# Patient Record
Sex: Male | Born: 1990 | Hispanic: No | Marital: Single | Smoking: Former smoker
Health system: Southern US, Community
[De-identification: ages and names within clinical notes are randomized; demographics above are authoritative.]

## PROBLEM LIST (undated history)

## (undated) DIAGNOSIS — G43909 Migraine, unspecified, not intractable, without status migrainosus: Secondary | ICD-10-CM

## (undated) DIAGNOSIS — E119 Type 2 diabetes mellitus without complications: Secondary | ICD-10-CM

## (undated) DIAGNOSIS — R739 Hyperglycemia, unspecified: Secondary | ICD-10-CM

## (undated) DIAGNOSIS — K760 Fatty (change of) liver, not elsewhere classified: Secondary | ICD-10-CM

## (undated) HISTORY — PX: APPENDECTOMY: SHX54

## (undated) HISTORY — DX: Fatty (change of) liver, not elsewhere classified: K76.0

## (undated) HISTORY — DX: Hyperglycemia, unspecified: R73.9

---
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
## 2013-04-11 ENCOUNTER — Emergency Department: Payer: Self-pay | Admitting: Emergency Medicine

## 2017-01-04 ENCOUNTER — Encounter: Payer: Self-pay | Admitting: General Surgery

## 2017-01-04 ENCOUNTER — Ambulatory Visit (INDEPENDENT_AMBULATORY_CARE_PROVIDER_SITE_OTHER): Payer: Commercial Managed Care - PPO | Admitting: General Surgery

## 2017-01-04 VITALS — BP 122/80 | HR 82 | Resp 12 | Ht 72.0 in | Wt 254.0 lb

## 2017-01-04 DIAGNOSIS — L0501 Pilonidal cyst with abscess: Secondary | ICD-10-CM

## 2017-01-04 NOTE — Progress Notes (Signed)
Patient ID: Nathaniel Clark, male   DOB: 04/09/1991, 26 y.o.   MRN: [id number]  Chief Complaint  Patient presents with  . Other  . Routine Post Op    HPI Nathaniel Clark is a 26 y.o. male here today for a evaluation of a pilonidal cyst. [facility]. Patient states he noticed this area about a week ago. Painful and hard to sit. He states this hs been going on for over five years now. He states the area has been been this bad before.  HPI  No past medical history on file.  Past Surgical History:  Procedure Laterality Date  . APPENDECTOMY      No family history on file.  Social History Social History  Substance Use Topics  . Smoking status: Light Tobacco Smoker  . Smokeless tobacco: Never Used  . Alcohol use Yes    No Known Allergies  Current Outpatient Prescriptions  Medication Sig Dispense Refill  . sulfamethoxazole-trimethoprim (BACTRIM DS,SEPTRA DS) 800-160 MG tablet Take 1 tablet by mouth 2 (two) times daily. for 10 days  0  . traMADol (ULTRAM) 50 MG tablet Take 50 mg by mouth every 6 (six) hours as needed. for pain  0   No current facility-administered medications for this visit.     Review of Systems Review of Systems  Constitutional: Negative.   Respiratory: Negative.   Cardiovascular: Negative.     Blood pressure 122/80, pulse 82, resp. rate 12, height 6' (1.829 m), weight 254 lb (115.2 kg).  Physical Exam Physical Exam  Constitutional: He is oriented to person, place, and time. He appears well-developed and well-nourished.  Eyes: Conjunctivae are normal.  Lymphadenopathy:       Right: No inguinal adenopathy present.       Left: No inguinal adenopathy present.  Neurological: He is alert and oriented to person, place, and time.  Skin: Skin is warm and dry.       Data Reviewed Notes reviewed   Assessment    Pilonidal abscess. Recommended drainage- procedure explained. Pt was agreeable.    Plan    Procedure: Incision and  drainage of pilonidal abscess The wound was anesthetized with 3 mL 0.5% marcaine mixed with 1% xylocaine and prepped with betadine.  A cruciate incision was made and murky purulent fluid mixed with blood was drained. Hemostat was used to evacuate the cavity. The incision was dressed with gauze and tape. The patient tolerated the procedure with mild discomfort. He was advised on wound care and told to complete the course of antibiotics (Bactrim). He may use heat. He was told to follow up in clinic in 10 days for wound evaluation.      HPI, Physical Exam, Assessment and Plan have been scribed under the direction and in the presence of Kathreen CosierS. G. Braddock Servellon, MD  Ples SpecterJessica Qualls, CMA  I have completed the exam and reviewed the above documentation for accuracy and completeness.  I agree with the above.  [employer] has been used and any errors in dictation or transcription are unintentional.  Danyah Guastella G. Evette CristalSankar, M.D., F.A.C.S.   Gerlene BurdockSANKAR,Zeva Leber G 01/04/2017, 3:54 PM

## 2017-01-16 ENCOUNTER — Ambulatory Visit: Payer: Commercial Managed Care - PPO | Admitting: General Surgery

## 2017-03-06 ENCOUNTER — Encounter: Payer: Self-pay | Admitting: *Deleted

## 2019-04-24 ENCOUNTER — Emergency Department
Admission: EM | Admit: 2019-04-24 | Discharge: 2019-04-24 | Disposition: A | Discharge status: Home / Self Care | Payer: Commercial Managed Care - PPO | Attending: Emergency Medicine | Admitting: Emergency Medicine

## 2019-04-24 ENCOUNTER — Other Ambulatory Visit: Payer: Self-pay

## 2019-04-24 ENCOUNTER — Emergency Department: Payer: Commercial Managed Care - PPO

## 2019-04-24 DIAGNOSIS — R519 Headache, unspecified: Secondary | ICD-10-CM | POA: Diagnosis not present

## 2019-04-24 DIAGNOSIS — F17200 Nicotine dependence, unspecified, uncomplicated: Secondary | ICD-10-CM | POA: Diagnosis not present

## 2019-04-24 DIAGNOSIS — R112 Nausea with vomiting, unspecified: Secondary | ICD-10-CM | POA: Diagnosis not present

## 2019-04-24 LAB — BASIC METABOLIC PANEL
Anion gap: 6 (ref 5–15)
BUN: 13 mg/dL (ref 6–20)
CO2: 29 mmol/L (ref 22–32)
Calcium: 8.9 mg/dL (ref 8.9–10.3)
Chloride: 104 mmol/L (ref 98–111)
Creatinine, Ser: 0.86 mg/dL (ref 0.61–1.24)
GFR calc Af Amer: >60 mL/min (ref >60)
GFR calc non Af Amer: >60 mL/min (ref >60)
Glucose, Bld: 103 mg/dL — ABNORMAL HIGH (ref 70–99)
Potassium: 3.8 mmol/L (ref 3.5–5.1)
Sodium: 139 mmol/L (ref 135–145)

## 2019-04-24 LAB — CBC
HCT: 44.8 % (ref 39.0–52.0)
Hemoglobin: 15.6 g/dL (ref 13.0–17.0)
MCH: 30.1 pg (ref 26.0–34.0)
MCHC: 34.8 g/dL (ref 30.0–36.0)
MCV: 86.3 fL (ref 80.0–100.0)
Platelets: 214 10*3/uL (ref 150–400)
RBC: 5.19 MIL/uL (ref 4.22–5.81)
RDW: 12.6 % (ref 11.5–15.5)
WBC: 6.2 10*3/uL (ref 4.0–10.5)
nRBC: 0 % (ref 0.0–0.2)

## 2019-04-24 MED ORDER — ONDANSETRON HCL 4 MG/2ML IJ SOLN
4.0000 mg | Freq: Once | INTRAMUSCULAR | Status: AC
  Administered 2019-04-24: 4 mg via INTRAVENOUS
  Filled 2019-04-24: qty 2

## 2019-04-24 MED ORDER — BUTALBITAL-APAP-CAFFEINE 50-325-40 MG PO TABS: 1.0000 | ORAL_TABLET | Freq: Four times a day (QID) | ORAL | 0 refills | Status: AC | PRN

## 2019-04-24 MED ORDER — ONDANSETRON 4 MG PO TBDP
4.0000 mg | ORAL_TABLET | Freq: Once | ORAL | Status: AC
  Administered 2019-04-24: 4 mg via ORAL
  Filled 2019-04-24: qty 1

## 2019-04-24 MED ORDER — IOHEXOL 350 MG/ML SOLN
75.0000 mL | Freq: Once | INTRAVENOUS | Status: AC | PRN
  Administered 2019-04-24: 75 mL via INTRAVENOUS

## 2019-04-24 MED ORDER — KETOROLAC TROMETHAMINE 30 MG/ML IJ SOLN
30.0000 mg | Freq: Once | INTRAMUSCULAR | Status: AC
  Administered 2019-04-24: 30 mg via INTRAVENOUS
  Filled 2019-04-24: qty 1

## 2019-04-24 NOTE — ED Provider Notes (Signed)
[facility] Emergency Department Provider Note ______________________   First MD Initiated Contact with Patient 04/24/19 [phone]     (approximate)  I have reviewed the triage vital signs and the nursing notes.   HISTORY  Chief Complaint Headache    HPI Nathaniel Clark is a 28 y.o. male presents to the emergency department secondary to a 1 week history of occipital headache which patient states radiates to the frontal area.  Patient states current pain score 6 out of 10 but at maximum intensity 10 out of 10.  Patient also admits to nausea and one episode of vomiting tonight.  Patient denies any fever no neck pain or stiffness.  Patient denies any weakness numbness gait instability or visual changes.  Patient does however admit to right eye tearing.  Patient denies any familial history of migraines or aneurysms.        History reviewed. No pertinent past medical history.  There are no problems to display for this patient.   Past Surgical History:  Procedure Laterality Date  . APPENDECTOMY      Prior to Admission medications   Medication Sig Start Date End Date Taking? Authorizing Provider  butalbital-acetaminophen-caffeine (FIORICET) 50-325-40 MG tablet Take 1 tablet by mouth every 6 (six) hours as needed for headache. 04/24/19 04/23/20  Darci CurrentBrown, [place] N, MD  sulfamethoxazole-trimethoprim (BACTRIM DS,SEPTRA DS) 800-160 MG tablet Take 1 tablet by mouth 2 (two) times daily. for 10 days 01/02/17   [provider]  traMADol (ULTRAM) 50 MG tablet Take 50 mg by mouth every 6 (six) hours as needed. for pain 01/02/17   [provider]    Allergies Patient has no known allergies.  History reviewed. No pertinent family history.  Social History Social History   Tobacco Use  . Smoking status: Light Tobacco Smoker  . Smokeless tobacco: Never Used  Substance Use Topics  . Alcohol use: Yes  . Drug use: No    Review of Systems Constitutional:  No fever/chills Eyes: No visual changes. ENT: No sore throat. Cardiovascular: Denies chest pain. Respiratory: Denies shortness of breath. Gastrointestinal: No abdominal pain.  No nausea, no vomiting.  No diarrhea.  No constipation. Genitourinary: Negative for dysuria. Musculoskeletal: Negative for neck pain.  Negative for back pain. Integumentary: Negative for rash. Neurological: Positive for headaches, negative for focal weakness or numbness.  ____________________________________________   PHYSICAL EXAM:  VITAL SIGNS: ED Triage Vitals  Enc Vitals Group     BP 04/24/19 0102 129/84     Pulse --      Resp 04/24/19 0102 14     Temp 04/24/19 0102 97.9 F (36.6 C)     Temp Source 04/24/19 0102 Oral     SpO2 04/24/19 0102 99 %     Weight 04/24/19 0103 111.1 kg (245 lb)     Height --      Head Circumference --      Peak Flow --      Pain Score 04/24/19 0103 6     Pain Loc --      Pain Edu? --      Excl. in GC? --     Constitutional: Alert and oriented. Eyes: Conjunctivae are normal.  Head: Atraumatic. Mouth/Throat: Patient is wearing a mask. Neck: No stridor.  No meningeal signs.   Cardiovascular: Normal rate, regular rhythm. Good peripheral circulation. Grossly normal heart sounds. Respiratory: Normal respiratory effort.  No retractions. Gastrointestinal: Soft and nontender. No distention.  Musculoskeletal: No lower extremity tenderness nor edema.  No gross deformities of extremities. Neurologic:  Normal speech and language. No gross focal neurologic deficits are appreciated.  Skin:  Skin is warm, dry and intact. Psychiatric: Mood and affect are normal. Speech and behavior are normal.  ____________________________________________   LABS (all labs ordered are listed, but only abnormal results are displayed)  Labs Reviewed  BASIC METABOLIC PANEL - Abnormal; Notable for the following components:      Result Value   Glucose, Bld 103 (*)    All other components within  normal limits  CBC   _______________________________________ RADIOLOGY I, [place] N Johnedward Brodrick, personally viewed and evaluated these images (plain radiographs) as part of my medical decision making, as well as reviewing the written report by the radiologist.  ED MD interpretation: Normal intracranial CTA findings concerning for possible idiopathic intracranial hypertension.  Official radiology report(s): CT Angio Head W or Wo Contrast  Result Date: 04/24/2019 CLINICAL DATA:  28 year old male with headache and nausea for several days. EXAM: CT ANGIOGRAPHY HEAD TECHNIQUE: Multidetector CT imaging of the head was performed using the standard protocol during bolus administration of intravenous contrast. Multiplanar CT image reconstructions and MIPs were obtained to evaluate the vascular anatomy. CONTRAST:  27mL OMNIPAQUE IOHEXOL 350 MG/ML SOLN COMPARISON:  None. FINDINGS: CT HEAD Brain: Cerebral volume is within normal limits. No midline shift, ventriculomegaly, mass effect, evidence of mass lesion, intracranial hemorrhage or evidence of cortically based acute infarction. Gray-white matter differentiation is within normal limits throughout the brain. Partially empty sella. Vascular: No suspicious intracranial vascular hyperdensity. Skull: Negative. Sinuses: Mild to moderate bilateral ethmoid sinus mucosal thickening. Similar mucosal thickening or retention cyst in the right sphenoid sinus. The frontal sinuses, tympanic cavities and mastoids are clear. Orbits: Visualized orbits and scalp soft tissues are within normal limits. CTA HEAD Posterior circulation: Patent in fairly codominant distal vertebral arteries. The left AICA appears dominant. There is bilateral PICA enhancement identified. Patent vertebrobasilar junction and basilar artery. Normal basilar tip, SCA and PCA origins. Posterior communicating arteries are diminutive or absent. Bilateral PCA branches are within normal limits. Anterior circulation:  Both ICA siphons are patent and normal aside from mild cavernous segment tortuosity. Patent and normal carotid termini, MCA and ACA origins. Normal anterior communicating artery. Bilateral ACA branches are normal. MCA M1 segments and bifurcations are normal. Bilateral MCA branches are within normal limits. Venous sinuses: Patent.  Mildly dominant right transverse sinus. There does appear to be some effacement of the junctions of both transverse and sigmoid sinuses. Anatomic variants: None. Review of the MIP images confirms the above findings IMPRESSION: 1. Normal intracranial CTA arterial findings. 2. Negative CT appearance of the brain aside from partially empty sella, noted in conjunction with an effaced appearance of the bilateral transverse and sigmoid dural venous sinus junctions. Although normal anatomic variation is possible, this constellation can be seen with idiopathic intracranial hypertension (pseudotumor cerebri). Electronically Signed   By: Odessa Fleming M.D.   On: 04/24/2019 05:37      Procedures   ____________________________________________   INITIAL IMPRESSION / MDM / ASSESSMENT AND PLAN / ED COURSE  As part of my medical decision making, I reviewed the following data within the electronic MEDICAL RECORD NUMBER  28 year old male presented with above-stated history and physical exam secondary to headache with differential diagnosis including cluster migraine tension headaches or more concerning pathology such as intracerebral mass, intracerebral aneurysm and idiopathic intracranial hypertension.  CT scan of the head findings consistent with possible idiopathic intracranial hypertension.  As such patient will be  referred to Dr. Irish Elders neurology for further outpatient evaluation and potential management.  Patient prescribed Fioricet ____________________________________________  FINAL CLINICAL IMPRESSION(S) / ED DIAGNOSES  Final diagnoses:  Nonintractable headache, unspecified chronicity  pattern, unspecified headache type     MEDICATIONS GIVEN DURING THIS VISIT:  Medications  ondansetron (ZOFRAN-ODT) disintegrating tablet 4 mg (4 mg Oral Given 04/24/19 0127)  iohexol (OMNIPAQUE) 350 MG/ML injection 75 mL (75 mLs Intravenous Contrast Given 04/24/19 0445)  ketorolac (TORADOL) 30 MG/ML injection 30 mg (30 mg Intravenous Given 04/24/19 0523)  ondansetron (ZOFRAN) injection 4 mg (4 mg Intravenous Given 04/24/19 0523)     ED Discharge Orders         Ordered    butalbital-acetaminophen-caffeine (FIORICET) 50-325-40 MG tablet  Every 6 hours PRN     04/24/19 0543          *Please note:  Osborn Catala was evaluated in Emergency Department on 04/24/2019 for the symptoms described in the history of present illness. He was evaluated in the context of the global COVID-19 pandemic, which necessitated consideration that the patient might be at risk for infection with the SARS-CoV-2 virus that causes COVID-19. Institutional protocols and algorithms that pertain to the evaluation of patients at risk for COVID-19 are in a state of rapid change based on information released by regulatory bodies including the CDC and federal and state organizations. These policies and algorithms were followed during the patient's care in the ED.  Some ED evaluations and interventions may be delayed as a result of limited staffing during the pandemic.*  Note:  This document was prepared using Dragon voice recognition software and may include unintentional dictation errors.   Gregor Hams, MD 04/24/19 [phone]

## 2019-04-24 NOTE — ED Triage Notes (Signed)
Pt presents via POV c/o headache and nausea for a couple of days. Denies fevers.

## 2019-04-24 NOTE — ED Notes (Signed)
Patient transported to CT 

## 2019-07-24 ENCOUNTER — Other Ambulatory Visit: Payer: Self-pay

## 2019-07-24 ENCOUNTER — Emergency Department (HOSPITAL_COMMUNITY): Payer: Commercial Managed Care - PPO

## 2019-07-24 ENCOUNTER — Observation Stay (HOSPITAL_COMMUNITY)
Admission: EM | Admit: 2019-07-24 | Discharge: 2019-07-25 | Discharge status: Against Medical Advice | Payer: Commercial Managed Care - PPO | Attending: Internal Medicine | Admitting: Internal Medicine

## 2019-07-24 ENCOUNTER — Encounter (HOSPITAL_COMMUNITY): Payer: Self-pay | Admitting: Family Medicine

## 2019-07-24 DIAGNOSIS — E669 Obesity, unspecified: Secondary | ICD-10-CM | POA: Insufficient documentation

## 2019-07-24 DIAGNOSIS — Z91018 Allergy to other foods: Secondary | ICD-10-CM | POA: Diagnosis not present

## 2019-07-24 DIAGNOSIS — Z6831 Body mass index (BMI) 31.0-31.9, adult: Secondary | ICD-10-CM | POA: Insufficient documentation

## 2019-07-24 DIAGNOSIS — Z20822 Contact with and (suspected) exposure to covid-19: Secondary | ICD-10-CM | POA: Insufficient documentation

## 2019-07-24 DIAGNOSIS — K760 Fatty (change of) liver, not elsewhere classified: Secondary | ICD-10-CM | POA: Diagnosis not present

## 2019-07-24 DIAGNOSIS — Z791 Long term (current) use of non-steroidal anti-inflammatories (NSAID): Secondary | ICD-10-CM | POA: Insufficient documentation

## 2019-07-24 DIAGNOSIS — R0902 Hypoxemia: Secondary | ICD-10-CM | POA: Diagnosis not present

## 2019-07-24 DIAGNOSIS — F17200 Nicotine dependence, unspecified, uncomplicated: Secondary | ICD-10-CM | POA: Diagnosis not present

## 2019-07-24 DIAGNOSIS — A084 Viral intestinal infection, unspecified: Secondary | ICD-10-CM | POA: Diagnosis not present

## 2019-07-24 LAB — URINALYSIS, ROUTINE W REFLEX MICROSCOPIC
Bacteria, UA: NONE SEEN
Bilirubin Urine: NEGATIVE
Glucose, UA: NEGATIVE mg/dL
Ketones, ur: NEGATIVE mg/dL
Leukocytes,Ua: NEGATIVE
Nitrite: NEGATIVE
Protein, ur: 30 mg/dL — AB
Specific Gravity, Urine: 1.02 (ref 1.005–1.030)
pH: 7 (ref 5.0–8.0)

## 2019-07-24 LAB — COMPREHENSIVE METABOLIC PANEL
ALT: 55 U/L — ABNORMAL HIGH (ref 0–44)
AST: 25 U/L (ref 15–41)
Albumin: 4 g/dL (ref 3.5–5.0)
Alkaline Phosphatase: 68 U/L (ref 38–126)
Anion gap: 9 (ref 5–15)
BUN: 5 mg/dL — ABNORMAL LOW (ref 6–20)
CO2: 26 mmol/L (ref 22–32)
Calcium: 9.2 mg/dL (ref 8.9–10.3)
Chloride: 102 mmol/L (ref 98–111)
Creatinine, Ser: 1.14 mg/dL (ref 0.61–1.24)
GFR calc Af Amer: >60 mL/min (ref >60)
GFR calc non Af Amer: >60 mL/min (ref >60)
Glucose, Bld: 118 mg/dL — ABNORMAL HIGH (ref 70–99)
Potassium: 3.8 mmol/L (ref 3.5–5.1)
Sodium: 137 mmol/L (ref 135–145)
Total Bilirubin: 1.1 mg/dL (ref 0.3–1.2)
Total Protein: 8.9 g/dL — ABNORMAL HIGH (ref 6.5–8.1)

## 2019-07-24 LAB — CBC
HCT: 49.7 % (ref 39.0–52.0)
Hemoglobin: 17.2 g/dL — ABNORMAL HIGH (ref 13.0–17.0)
MCH: 30.3 pg (ref 26.0–34.0)
MCHC: 34.6 g/dL (ref 30.0–36.0)
MCV: 87.7 fL (ref 80.0–100.0)
Platelets: 217 10*3/uL (ref 150–400)
RBC: 5.67 MIL/uL (ref 4.22–5.81)
RDW: 12.2 % (ref 11.5–15.5)
WBC: 7.5 10*3/uL (ref 4.0–10.5)
nRBC: 0 % (ref 0.0–0.2)

## 2019-07-24 LAB — LIPASE, BLOOD: Lipase: 21 U/L (ref 11–51)

## 2019-07-24 LAB — TROPONIN I (HIGH SENSITIVITY): Troponin I (High Sensitivity): 2 ng/L (ref <18)

## 2019-07-24 LAB — GROUP A STREP BY PCR: Group A Strep by PCR: NOT DETECTED

## 2019-07-24 LAB — POC SARS CORONAVIRUS 2 AG -  ED: SARS Coronavirus 2 Ag: NEGATIVE

## 2019-07-24 MED ORDER — SODIUM CHLORIDE 0.9 % IV BOLUS
1000.0000 mL | Freq: Once | INTRAVENOUS | Status: AC
  Administered 2019-07-24: 16:00:00 1000 mL via INTRAVENOUS

## 2019-07-24 MED ORDER — BENZONATATE 100 MG PO CAPS
100.0000 mg | ORAL_CAPSULE | Freq: Once | ORAL | Status: AC
  Administered 2019-07-24: 16:00:00 100 mg via ORAL
  Filled 2019-07-24: qty 1

## 2019-07-24 MED ORDER — GUAIFENESIN-DM 100-10 MG/5ML PO SYRP
5.0000 mL | ORAL_SOLUTION | ORAL | Status: DC | PRN
  Administered 2019-07-24 – 2019-07-25 (×2): 5 mL via ORAL
  Filled 2019-07-24: qty 5
  Filled 2019-07-24: qty 10

## 2019-07-24 MED ORDER — ACETAMINOPHEN 325 MG PO TABS
650.0000 mg | ORAL_TABLET | Freq: Once | ORAL | Status: AC
  Administered 2019-07-24: 650 mg via ORAL
  Filled 2019-07-24: qty 2

## 2019-07-24 MED ORDER — ONDANSETRON HCL 4 MG/2ML IJ SOLN
4.0000 mg | Freq: Four times a day (QID) | INTRAMUSCULAR | Status: DC | PRN
  Administered 2019-07-24 – 2019-07-25 (×2): 4 mg via INTRAVENOUS
  Filled 2019-07-24 (×2): qty 2

## 2019-07-24 MED ORDER — IOHEXOL 350 MG/ML SOLN
100.0000 mL | Freq: Once | INTRAVENOUS | Status: AC | PRN
  Administered 2019-07-24: 21:00:00 100 mL via INTRAVENOUS

## 2019-07-24 MED ORDER — SODIUM CHLORIDE 0.9 % IV SOLN: INTRAVENOUS | Status: AC

## 2019-07-24 MED ORDER — ONDANSETRON HCL 4 MG/2ML IJ SOLN
4.0000 mg | Freq: Once | INTRAMUSCULAR | Status: AC
  Administered 2019-07-24: 16:00:00 4 mg via INTRAVENOUS
  Filled 2019-07-24: qty 2

## 2019-07-24 MED ORDER — ACETAMINOPHEN 325 MG PO TABS: 650.0000 mg | ORAL_TABLET | Freq: Four times a day (QID) | ORAL | Status: DC | PRN

## 2019-07-24 MED ORDER — FAMOTIDINE IN NACL 20-0.9 MG/50ML-% IV SOLN
20.0000 mg | Freq: Two times a day (BID) | INTRAVENOUS | Status: DC
  Administered 2019-07-25: 10:00:00 20 mg via INTRAVENOUS
  Filled 2019-07-24 (×2): qty 50

## 2019-07-24 MED ORDER — SODIUM CHLORIDE 0.9% FLUSH
3.0000 mL | Freq: Once | INTRAVENOUS | Status: AC
  Administered 2019-07-24: 16:00:00 3 mL via INTRAVENOUS

## 2019-07-24 NOTE — ED Notes (Signed)
PA in to speak with patient

## 2019-07-24 NOTE — H&P (Signed)
History and Physical    Nathaniel Clark FTD:[id number] DOB: 1990-08-10 DOA: 07/24/2019  PCP: Patient, No Pcp Per   Patient coming from: Home   Chief Complaint: Fevers, cough, sore throat, N/V/D   HPI: Nathaniel Clark is a 29 y.o. male who denies any significant past medical history and now presents to the ED for evaluation of nausea, vomiting, diarrhea, cough, sore throat, and fevers.  Patient reports that he was recently exposed to a coworker with confirmed COVID-19 and the patient himself developed the aforementioned symptoms 2 to 3 days ago.  Symptoms have been worsening and he has had trouble eating or drinking anything without vomiting.  He feels short of breath with exertion.  Cough is nonproductive.  Denies any chest pain or palpitations.  Denies lightheadedness or near syncope.  Diarrhea has been watery.  ED Course: Upon arrival to the ED, patient is found to be afebrile, saturating well on room air, tachycardic to 120, and with stable blood pressure.  EKG features a sinus rhythm with ST elevation likely early repolarization abnormality.  Chest x-ray is negative for acute findings and CTA chest is negative for PE or other acute intrathoracic pathology.  Chemistry panel with creatinine 1.14 and AST 55.  CBC with hemoglobin 17.2.  Covid antigen is negative.  Covid PCR is pending.  Patient was given a liter of saline, acetaminophen, Zofran, and Tessalon in the ED.  Review of Systems:  All other systems reviewed and apart from HPI, are negative.  History reviewed. No pertinent past medical history.  Past Surgical History:  Procedure Laterality Date  . APPENDECTOMY       reports that he has been smoking. He has never used smokeless tobacco. He reports current alcohol use. He reports that he does not use drugs.  Allergies  Allergen Reactions  . Pork-Derived Products Other (See Comments)    Religious reasons    History reviewed. No pertinent family history.   Prior to Admission  medications   Medication Sig Start Date End Date Taking? Authorizing Provider  fluticasone (FLONASE) 50 MCG/ACT nasal spray Place 2 sprays into both nostrils daily.   Yes [provider]  guaiFENesin (MUCINEX) 600 MG 12 hr tablet Take 600 mg by mouth 2 (two) times daily.   Yes [provider]  ibuprofen (ADVIL) 800 MG tablet Take 800 mg by mouth 3 (three) times daily. 07/23/19  Yes [provider]  butalbital-acetaminophen-caffeine (FIORICET) 50-325-40 MG tablet Take 1 tablet by mouth every 6 (six) hours as needed for headache. Patient not taking: Reported on 07/24/2019 04/24/19 04/23/20  Gregor Hams, MD    Physical Exam: Vitals:   07/24/19 1900 07/24/19 2130 07/24/19 2145 07/24/19 2300  BP: 125/85 (!) 128/116 111/82 106/64  Pulse: 85 88 81 85  Resp: 13 18 17 17   Temp:      TempSrc:      SpO2: 94% 96% 95% 99%  Weight:      Height:        Constitutional: NAD, calm  Eyes: PERTLA, lids and conjunctivae normal ENMT: Mucous membranes are moist. Posterior pharynx clear of any exudate or lesions.   Neck: normal, supple, no masses, no thyromegaly Respiratory:  no wheezing, no crackles. No accessory muscle use.  Cardiovascular: S1 & S2 heard, regular rate and rhythm. No extremity edema.   Abdomen: No distension, no tenderness, soft. Bowel sounds active.  Musculoskeletal: no clubbing / cyanosis. No joint deformity upper and lower extremities.   Skin: no significant rashes, lesions, ulcers.  Warm, dry, well-perfused. Neurologic: No facial asymmetry. Sensation intact. Moving all extremeties.     Psychiatric: Alert and oriented x 3. Pleasant and cooperative.    Labs and Imaging on Admission: I have personally reviewed following labs and imaging studies  CBC: Recent Labs  Lab 07/24/19 1120  WBC 7.5  HGB 17.2*  HCT 49.7  MCV 87.7  PLT 217   Basic Metabolic Panel: Recent Labs  Lab 07/24/19 1120  NA 137  K 3.8  CL 102  CO2 26  GLUCOSE 118*  BUN 5*    CREATININE 1.14  CALCIUM 9.2   GFR: Estimated Creatinine Clearance: 123.6 mL/min (by C-G formula based on SCr of 1.14 mg/dL). Liver Function Tests: Recent Labs  Lab 07/24/19 1120  AST 25  ALT 55*  ALKPHOS 68  BILITOT 1.1  PROT 8.9*  ALBUMIN 4.0   Recent Labs  Lab 07/24/19 1120  LIPASE 21   No results for input(s): AMMONIA in the last 168 hours. Coagulation Profile: No results for input(s): INR, PROTIME in the last 168 hours. Cardiac Enzymes: No results for input(s): CKTOTAL, CKMB, CKMBINDEX, TROPONINI in the last 168 hours. BNP (last 3 results) No results for input(s): PROBNP in the last 8760 hours. HbA1C: No results for input(s): HGBA1C in the last 72 hours. CBG: No results for input(s): GLUCAP in the last 168 hours. Lipid Profile: No results for input(s): CHOL, HDL, LDLCALC, TRIG, CHOLHDL, LDLDIRECT in the last 72 hours. Thyroid Function Tests: No results for input(s): TSH, T4TOTAL, FREET4, T3FREE, THYROIDAB in the last 72 hours. Anemia Panel: No results for input(s): VITAMINB12, FOLATE, FERRITIN, TIBC, IRON, RETICCTPCT in the last 72 hours. Urine analysis:    Component Value Date/Time   COLORURINE YELLOW 07/24/2019 1615   APPEARANCEUR CLEAR 07/24/2019 1615   LABSPEC 1.020 07/24/2019 1615   PHURINE 7.0 07/24/2019 1615   GLUCOSEU NEGATIVE 07/24/2019 1615   HGBUR MODERATE (A) 07/24/2019 1615   BILIRUBINUR NEGATIVE 07/24/2019 1615   KETONESUR NEGATIVE 07/24/2019 1615   PROTEINUR 30 (A) 07/24/2019 1615   NITRITE NEGATIVE 07/24/2019 1615   LEUKOCYTESUR NEGATIVE 07/24/2019 1615   Sepsis Labs: @LABRCNTIP (procalcitonin:4,lacticidven:4) ) Recent Results (from the past 240 hour(s))  Group A Strep by PCR     Status: None   Collection Time: 07/24/19  3:58 PM   Specimen: Throat; Sterile Swab  Result Value Ref Range Status   Group A Strep by PCR NOT DETECTED NOT DETECTED Final    Comment: Performed at [facility], [address] [place], [place]      Radiological Exams on Admission: CT Angio Chest PE W and/or Wo Contrast  Result Date: 07/24/2019 CLINICAL DATA:  29 year old male with hypoxia. EXAM: CT ANGIOGRAPHY CHEST WITH CONTRAST TECHNIQUE: Multidetector CT imaging of the chest was performed using the standard protocol during bolus administration of intravenous contrast. Multiplanar CT image reconstructions and MIPs were obtained to evaluate the vascular anatomy. CONTRAST:  26 OMNIPAQUE IOHEXOL 350 MG/ML SOLN COMPARISON:  Chest radiograph dated 07/24/2019. FINDINGS: Cardiovascular: There is no cardiomegaly or pericardial effusion. The thoracic aorta is unremarkable. Evaluation of the pulmonary arteries is somewhat limited due to suboptimal opacification. No definite pulmonary artery embolus identified. Mediastinum/Nodes: There is no hilar or mediastinal adenopathy. The esophagus is grossly unremarkable. No mediastinal fluid collection. Lungs/Pleura: The lungs are clear. There is no pleural effusion or pneumothorax. The central airways are patent. Upper Abdomen: Fatty liver. Musculoskeletal: No chest wall abnormality. No acute or significant osseous findings. Review of the MIP images  confirms the above findings. IMPRESSION: 1. No acute intrathoracic pathology. No pulmonary artery embolus identified. 2. Fatty liver. Electronically Signed   By: Elgie Collard M.D.   On: 07/24/2019 21:23   DG Chest Portable 1 View  Result Date: 07/24/2019 CLINICAL DATA:  COVID exposure.  Cold-like symptoms for 2 weeks. EXAM: PORTABLE CHEST 1 VIEW COMPARISON:  04/11/2013 FINDINGS: Normal heart, mediastinum and hila. Clear lungs.  No pleural effusion or pneumothorax. Skeletal structures are grossly unremarkable. IMPRESSION: No active disease. Electronically Signed   By: Amie Portland M.D.   On: 07/24/2019 16:12    EKG: Independently reviewed. Sinus rhythm, STE likely early repolarization pattern.   Assessment/Plan   1. Viral gastroenteritis  - Presents  with 2 days of fever/chills, non-productive cough, and N/V/D; reports recent contact with COVID-19  - COVID Ag negative and pcr test pending  - He has had trouble tolerating adequate oral intake at home, appears hypovolemic, benign abdominal exam, and no acute findings on CTA chest  - Plan to continue IVF hydration, antiemetics, advance diet as tolerated, monitor electrolytes, continue airborne isolation pending COVID PCR results     DVT prophylaxis: SCDs Code Status: Full  Family Communication: Discussed with pateint  Disposition Plan: Likely back home within 24-48 hours if able to tolerate adequate oral intake and respiratory sxs not worsening significantly  Consults called: None  Admission status: observation     Briscoe Deutscher, MD [facility] Pager: See [email or web address]  If 7AM-7PM, please contact the daytime attending [email or web address]  07/24/2019, 11:36 PM

## 2019-07-24 NOTE — ED Notes (Signed)
Ambulated pt in the hall with pulse ox 02 started at 96% and dropped down to 91% pt complains of SOB while ambulating no other complaints noted at this time

## 2019-07-24 NOTE — ED Provider Notes (Addendum)
[facility] EMERGENCY DEPARTMENT Provider Note   CSN: [id number] Arrival date & time: 07/24/19  1042     History Chief Complaint  Patient presents with  . Fever  . Sore Throat  . Emesis    Nathaniel Clark is a 29 y.o. male otherwise healthy no daily medication use presents today in his third day of illness.  Initially started off with fever, body aches and chills.  Symptoms have gradually progressed and has recently developed nausea vomiting and cough productive with clear white sputum.  He reports that he had a Covid test yesterday which was negative.  However he reports that his coworker who he works very closely with did test positive for Covid 3 days ago and he has been in close contact with them recently.  Patient describes generalized body aches, mild in intensity constant worsened with movement improved with rest, nonradiating.  He describes cough productive with clear white sputum, mild shortness of breath after coughing, no associated chest pain.  Describes nausea with few 1-2 episodes of nonbloody/nonbilious emesis over the last 2 days.  He also reports mild nonbloody diarrhea as well.  Additionally reports a mild sore throat a bilateral scratchy sensation with swallowing.  Denies headache, vision changes, neck stiffness, chest pain, hemoptysis, extremity swelling/color change, numbness/tingling, weakness or any additional concerns.  HPI     No past medical history on file.  Patient Active Problem List   Diagnosis Date Noted  . Viral gastroenteritis 07/24/2019    Past Surgical History:  Procedure Laterality Date  . APPENDECTOMY         No family history on file.  Social History   Tobacco Use  . Smoking status: Light Tobacco Smoker  . Smokeless tobacco: Never Used  Substance Use Topics  . Alcohol use: Yes  . Drug use: No    Home Medications Prior to Admission medications   Medication Sig Start Date End Date Taking? Authorizing Provider   butalbital-acetaminophen-caffeine (FIORICET) 50-325-40 MG tablet Take 1 tablet by mouth every 6 (six) hours as needed for headache. 04/24/19 04/23/20  Darci Current, MD  ibuprofen (ADVIL) 800 MG tablet Take 800 mg by mouth 3 (three) times daily. 07/23/19   [provider]  sulfamethoxazole-trimethoprim (BACTRIM DS,SEPTRA DS) 800-160 MG tablet Take 1 tablet by mouth 2 (two) times daily. for 10 days 01/02/17   [provider]  traMADol (ULTRAM) 50 MG tablet Take 50 mg by mouth every 6 (six) hours as needed. for pain 01/02/17   [provider]    Allergies    Pork-derived products  Review of Systems   Review of Systems Ten systems are reviewed and are negative for acute change except as noted in the HPI  Physical Exam Updated Vital Signs BP 111/82   Pulse 81   Temp 100 F (37.8 C) (Oral)   Resp 17   Ht 6\' 1"  (1.854 m)   Wt 106.6 kg   SpO2 95%   BMI 31.00 kg/m   Physical Exam Constitutional:      General: He is not in acute distress.    Appearance: Normal appearance. He is well-developed. He is not ill-appearing or diaphoretic.  HENT:     Head: Normocephalic and atraumatic.     Right Ear: External ear normal.     Left Ear: External ear normal.     Nose: Nose normal.     Mouth/Throat:     Comments: The patient has normal phonation and is in control of  secretions. No stridor.  Midline uvula without edema. Soft palate rises symmetrically. Minimal tonsillar erythema without swelling or exudates. Tongue protrusion is normal, floor of mouth is soft. No trismus. No creptius on neck palpation. No gingival erythema or fluctuance noted. Mucus membranes moist. Eyes:     General: Vision grossly intact. Gaze aligned appropriately.     Pupils: Pupils are equal, round, and reactive to light.  Neck:     Trachea: Trachea and phonation normal. No tracheal deviation.  Cardiovascular:     Rate and Rhythm: Regular rhythm. Tachycardia present.  Pulmonary:     Effort:  Pulmonary effort is normal. No accessory muscle usage or respiratory distress.     Breath sounds: Normal breath sounds and air entry.  Abdominal:     General: There is no distension.     Palpations: Abdomen is soft.     Tenderness: There is no abdominal tenderness. There is no guarding or rebound.  Musculoskeletal:        General: Normal range of motion.     Cervical back: Normal range of motion.  Skin:    General: Skin is warm and dry.  Neurological:     Mental Status: He is alert.     GCS: GCS eye subscore is 4. GCS verbal subscore is 5. GCS motor subscore is 6.     Comments: Speech is clear and goal oriented, follows commands Major Cranial nerves without deficit, no facial droop Moves extremities without ataxia, coordination intact Normal gait  Psychiatric:        Behavior: Behavior normal.     ED Results / Procedures / Treatments   Labs (all labs ordered are listed, but only abnormal results are displayed) Labs Reviewed  COMPREHENSIVE METABOLIC PANEL - Abnormal; Notable for the following components:      Result Value   Glucose, Bld 118 (*)    BUN 5 (*)    Total Protein 8.9 (*)    ALT 55 (*)    All other components within normal limits  CBC - Abnormal; Notable for the following components:   Hemoglobin 17.2 (*)    All other components within normal limits  URINALYSIS, ROUTINE W REFLEX MICROSCOPIC - Abnormal; Notable for the following components:   Hgb urine dipstick MODERATE (*)    Protein, ur 30 (*)    All other components within normal limits  GROUP A STREP BY PCR  SARS CORONAVIRUS 2 (TAT 6-24 HRS)  LIPASE, BLOOD  POC SARS CORONAVIRUS 2 AG -  ED  TROPONIN I (HIGH SENSITIVITY)    EKG None  Radiology CT Angio Chest PE W and/or Wo Contrast  Result Date: 07/24/2019 CLINICAL DATA:  29 year old male with hypoxia. EXAM: CT ANGIOGRAPHY CHEST WITH CONTRAST TECHNIQUE: Multidetector CT imaging of the chest was performed using the standard protocol during bolus  administration of intravenous contrast. Multiplanar CT image reconstructions and MIPs were obtained to evaluate the vascular anatomy. CONTRAST:  174mL OMNIPAQUE IOHEXOL 350 MG/ML SOLN COMPARISON:  Chest radiograph dated 07/24/2019. FINDINGS: Cardiovascular: There is no cardiomegaly or pericardial effusion. The thoracic aorta is unremarkable. Evaluation of the pulmonary arteries is somewhat limited due to suboptimal opacification. No definite pulmonary artery embolus identified. Mediastinum/Nodes: There is no hilar or mediastinal adenopathy. The esophagus is grossly unremarkable. No mediastinal fluid collection. Lungs/Pleura: The lungs are clear. There is no pleural effusion or pneumothorax. The central airways are patent. Upper Abdomen: Fatty liver. Musculoskeletal: No chest wall abnormality. No acute or significant osseous findings. Review of the MIP  images confirms the above findings. IMPRESSION: 1. No acute intrathoracic pathology. No pulmonary artery embolus identified. 2. Fatty liver. Electronically Signed   By: Elgie Collard M.D.   On: 07/24/2019 21:23   DG Chest Portable 1 View  Result Date: 07/24/2019 CLINICAL DATA:  COVID exposure.  Cold-like symptoms for 2 weeks. EXAM: PORTABLE CHEST 1 VIEW COMPARISON:  04/11/2013 FINDINGS: Normal heart, mediastinum and hila. Clear lungs.  No pleural effusion or pneumothorax. Skeletal structures are grossly unremarkable. IMPRESSION: No active disease. Electronically Signed   By: Amie Portland M.D.   On: 07/24/2019 16:12    Procedures Procedures (including critical care time)  Medications Ordered in ED Medications  0.9 %  sodium chloride infusion (has no administration in time range)  ondansetron (ZOFRAN) injection 4 mg (4 mg Intravenous Given 07/24/19 2208)  acetaminophen (TYLENOL) tablet 650 mg (has no administration in time range)  guaiFENesin-dextromethorphan (ROBITUSSIN DM) 100-10 MG/5ML syrup 5 mL (5 mLs Oral Given 07/24/19 2209)  sodium chloride  flush (NS) 0.9 % injection 3 mL (3 mLs Intravenous Given 07/24/19 1619)  sodium chloride 0.9 % bolus 1,000 mL (0 mLs Intravenous Stopped 07/24/19 1900)  acetaminophen (TYLENOL) tablet 650 mg (650 mg Oral Given 07/24/19 1618)  ondansetron (ZOFRAN) injection 4 mg (4 mg Intravenous Given 07/24/19 1623)  benzonatate (TESSALON) capsule 100 mg (100 mg Oral Given 07/24/19 1618)  iohexol (OMNIPAQUE) 350 MG/ML injection 100 mL (100 mLs Intravenous Contrast Given 07/24/19 2109)    ED Course  I have reviewed the triage vital signs and the nursing notes.  Pertinent labs & imaging results that were available during my care of the patient were reviewed by me and considered in my medical decision making (see chart for details).  Clinical Course as of Jul 23 2208  Thu Jul 24, 2019  2115 Dr. Antionette Char   [BM]    Clinical Course User Index [BM] Elizabeth Palau   MDM Rules/Calculators/A&P                     29 year old male otherwise healthy no daily medication use presents today with viral type symptoms in his third day of illness.  He is describing body aches, sore throat, cough and fever/chills.  He also reports few episodes of nausea vomiting and diarrhea over the past 2 days.  He denies any chest pain or abdominal pain.  On arrival he has borderline fever, tachycardic, no hypotension, tachypnea or hypoxia on room air.  Basic labs obtained in triage reviewed.  CBC shows hemoglobin 17.2, no leukocytosis.  CMP shows ALT 55, glucose 118, no emergent electrolyte derangement, elevation or kidney injury or emergent elevation of LFTs.  Patient is uncomfortable appearing, suspect likely due to viral illness, lung sounds are clear tenderness and abdomen is soft nontender and without peritoneal signs.  Doubt appendicitis, cholecystitis, SBO, perforation or other emergent abdominal pelvic etiology of patient's pain today.  Will obtain a rapid Covid test, strep test and chest x-ray, will give fluids and Tylenol as likely  etiology of tachycardia today was fever and dehydration.  Remainder of tests pending. - Rapid Covid test negative, will proceed with outpatient Covid test for follow-up.  Strep test negative, he has no evidence of PTA, RPA, Ludwig's or other deep space infections requiring imaging or antibiotics at this time.  Chest x-ray shows no active disease, I have personally reviewed patient's chest x-ray and agree with radiologist interpretation.  Urinalysis shows hemoglobin, 30 protein and 21-50 RBCs, no evidence of  infection.  Patient symptoms not consistent with kidney stone disease, may be secondary to illness and dehydration. - Patient was ambulated with nursing staff, SPO2 dropped to 91% on room air, he became short of breath with ambulation.  Plan to admit patient.  CT chest angio PE study ordered, lower suspicion for PE at this time given patient has no hemoptysis or chest pain however CT will better define any possible atypical infection as well.  EKG and troponin added to work-up.  Discussed case with Dr. Deretha Emory who agrees with plan.  After CT results will call for admission. - Patient reassessed, sitting comfortably in bed, he did attempt to leave AMA earlier however shared decision making was made and patient elects to remain in the ER for further work-up and admission. - High-sensitivity troponin within normal limits.  EKG reviewed with Dr. Deretha Emory shows no ischemic changes, early repole.  Patient chest pain-free and denies shortness of breath at rest.  CT Angio PE study:  IMPRESSION:  1. No acute intrathoracic pathology. No pulmonary artery embolus  identified.  2. Fatty liver.  Patient reassessed resting comfortably no acute distress.  States understanding of care plan and is agreeable for admission.  Feel patient would benefit from an observation admission at this point given his desaturation with ambulation.  Consult placed to hospitalist service for admission. - Discussed case with Dr.  Antionette Char who is accepted patient to their service.  Abran Luginbill was evaluated in Emergency Department on 07/24/2019 for the symptoms described in the history of present illness. He was evaluated in the context of the global COVID-19 pandemic, which necessitated consideration that the patient might be at risk for infection with the SARS-CoV-2 virus that causes COVID-19. Institutional protocols and algorithms that pertain to the evaluation of patients at risk for COVID-19 are in a state of rapid change based on information released by regulatory bodies including the CDC and federal and state organizations. These policies and algorithms were followed during the patient's care in the ED.   Note: Portions of this report may have been transcribed using voice recognition software. Every effort was made to ensure accuracy; however, inadvertent computerized transcription errors may still be present. Final Clinical Impression(s) / ED Diagnoses Final diagnoses:  Suspected COVID-19 virus infection    Rx / DC Orders ED Discharge Orders    None       Bill Salinas, PA-C 07/24/19 2210    Bill Salinas, PA-C 07/24/19 2210    Vanetta Mulders, MD 08/06/19 [phone]

## 2019-07-24 NOTE — ED Notes (Signed)
PA notified that patient requesting meds for cough and nausea

## 2019-07-24 NOTE — ED Triage Notes (Signed)
Pt here for three days of n/v, cough, sore throat, and fever. Negative rapid covid test yesterday. Recent extended contact with coworker who tested positive for covid.

## 2019-07-25 DIAGNOSIS — Z20822 Contact with and (suspected) exposure to covid-19: Secondary | ICD-10-CM

## 2019-07-25 DIAGNOSIS — A084 Viral intestinal infection, unspecified: Secondary | ICD-10-CM | POA: Diagnosis not present

## 2019-07-25 LAB — INFLUENZA PANEL BY PCR (TYPE A & B)
Influenza A By PCR: NEGATIVE
Influenza B By PCR: NEGATIVE

## 2019-07-25 LAB — CBC WITH DIFFERENTIAL/PLATELET
Abs Immature Granulocytes: 0.01 10*3/uL (ref 0.00–0.07)
Basophils Absolute: 0 10*3/uL (ref 0.0–0.1)
Basophils Relative: 0 %
Eosinophils Absolute: 0.4 10*3/uL (ref 0.0–0.5)
Eosinophils Relative: 7 %
HCT: 44.1 % (ref 39.0–52.0)
Hemoglobin: 15.4 g/dL (ref 13.0–17.0)
Immature Granulocytes: 0 %
Lymphocytes Relative: 39 %
Lymphs Abs: 2.1 10*3/uL (ref 0.7–4.0)
MCH: 30.6 pg (ref 26.0–34.0)
MCHC: 34.9 g/dL (ref 30.0–36.0)
MCV: 87.7 fL (ref 80.0–100.0)
Monocytes Absolute: 0.7 10*3/uL (ref 0.1–1.0)
Monocytes Relative: 13 %
Neutro Abs: 2.2 10*3/uL (ref 1.7–7.7)
Neutrophils Relative %: 41 %
Platelets: 168 10*3/uL (ref 150–400)
RBC: 5.03 MIL/uL (ref 4.22–5.81)
RDW: 12.3 % (ref 11.5–15.5)
WBC: 5.4 10*3/uL (ref 4.0–10.5)
nRBC: 0 % (ref 0.0–0.2)

## 2019-07-25 LAB — SARS CORONAVIRUS 2 (TAT 6-24 HRS): SARS Coronavirus 2: NEGATIVE

## 2019-07-25 LAB — COMPREHENSIVE METABOLIC PANEL
ALT: 42 U/L (ref 0–44)
AST: 21 U/L (ref 15–41)
Albumin: 3.4 g/dL — ABNORMAL LOW (ref 3.5–5.0)
Alkaline Phosphatase: 58 U/L (ref 38–126)
Anion gap: 9 (ref 5–15)
BUN: 7 mg/dL (ref 6–20)
CO2: 26 mmol/L (ref 22–32)
Calcium: 8.6 mg/dL — ABNORMAL LOW (ref 8.9–10.3)
Chloride: 103 mmol/L (ref 98–111)
Creatinine, Ser: 0.98 mg/dL (ref 0.61–1.24)
GFR calc Af Amer: >60 mL/min (ref >60)
GFR calc non Af Amer: >60 mL/min (ref >60)
Glucose, Bld: 97 mg/dL (ref 70–99)
Potassium: 3.5 mmol/L (ref 3.5–5.1)
Sodium: 138 mmol/L (ref 135–145)
Total Bilirubin: 1 mg/dL (ref 0.3–1.2)
Total Protein: 6.7 g/dL (ref 6.5–8.1)

## 2019-07-25 LAB — HIV ANTIBODY (ROUTINE TESTING W REFLEX): HIV Screen 4th Generation wRfx: NONREACTIVE

## 2019-07-25 MED ORDER — KETOROLAC TROMETHAMINE 30 MG/ML IJ SOLN
30.0000 mg | Freq: Four times a day (QID) | INTRAMUSCULAR | Status: DC | PRN
  Administered 2019-07-25: 30 mg via INTRAVENOUS
  Filled 2019-07-25: qty 1

## 2019-07-25 MED ORDER — PROMETHAZINE HCL 25 MG PO TABS: 12.5000 mg | ORAL_TABLET | Freq: Four times a day (QID) | ORAL | Status: DC | PRN

## 2019-07-25 MED ORDER — FLUTICASONE PROPIONATE 50 MCG/ACT NA SUSP
1.0000 | Freq: Every day | NASAL | Status: DC
  Filled 2019-07-25: qty 16

## 2019-07-25 MED ORDER — HYDROCODONE-ACETAMINOPHEN 5-325 MG PO TABS
1.0000 | ORAL_TABLET | ORAL | Status: DC | PRN
  Administered 2019-07-25 (×2): 2 via ORAL
  Filled 2019-07-25 (×2): qty 2

## 2019-07-25 NOTE — Progress Notes (Signed)
PROGRESS NOTE    Nathaniel Clark  WCB:[id number] DOB: 1990/08/28 DOA: 07/24/2019 PCP: Patient, No Pcp Per    Brief Narrative:  29yo with no significant past medical hx presented with 2 days of cough, nausea, vomiting, abd pain, diarrhea, sinus congestion, generalized malaise, joint pains. Pt works with co-worker recently diagnosed with COVID. All employees at workplace including co-worker diagnosed with COVID wore masks.  Assessment & Plan:   Principal Problem:   Viral gastroenteritis   1. Viral syndrome with intractable n/v/d 1. COVID neg, flu neg 2. Constellation of symptoms are highly suggestive of viral illness 3. As of this AM, pt is able to tolerate PO intake without emesis 4. Still having marked 10/10 pains involving abd and head requiring IV toradol 5. Will cont to monitor. If symptoms do not improve, low threshold for abdominal imaging 6. Cont to encourage PO intake as tolerated 7. Currently afebrile 8. Will repeat cmp and CBC in AM 2. Obesity 1. BMI 31 2. Recommend diet/lifestyle modification  DVT prophylaxis: SCD's Code Status: Full Family Communication: Pt in room, family at bedside Disposition Plan: From home, possible d/c home on 3/27 if symptoms improve  Consultants:     Procedures:     Antimicrobials: Anti-infectives (From admission, onward)   None       Subjective: Now tolerating PO intake, however still having marked headache, muscle aches and abd discomfort  Objective: Vitals:   07/25/19 0000 07/25/19 0113 07/25/19 0858 07/25/19 1300  BP: 105/70 131/76 125/63 108/67  Pulse: 92 77 71 72  Resp: 19 18 20 20   Temp:  97.9 F (36.6 C) 98.9 F (37.2 C) 98.4 F (36.9 C)  TempSrc:  Oral Oral Oral  SpO2: 94% 97% 95% 98%  Weight:      Height:       No intake or output data in the 24 hours ending 07/25/19 1824 Filed Weights   07/24/19 1044  Weight: 106.6 kg    Examination:  General exam: Appears calm and comfortable  Respiratory system:  Clear to auscultation. Respiratory effort normal. Cardiovascular system: S1 & S2 heard, Regular Gastrointestinal system: Obese, nondistended Central nervous system: Alert and oriented. No focal neurological deficits. Extremities: Symmetric 5 x 5 power. Skin: No rashes, lesions  Psychiatry: Judgement and insight appear normal. Mood & affect appropriate.   Data Reviewed: I have personally reviewed following labs and imaging studies  CBC: Recent Labs  Lab 07/24/19 1120 07/25/19 0534  WBC 7.5 5.4  NEUTROABS  --  2.2  HGB 17.2* 15.4  HCT 49.7 44.1  MCV 87.7 87.7  PLT 217 168   Basic Metabolic Panel: Recent Labs  Lab 07/24/19 1120 07/25/19 0534  NA 137 138  K 3.8 3.5  CL 102 103  CO2 26 26  GLUCOSE 118* 97  BUN 5* 7  CREATININE 1.14 0.98  CALCIUM 9.2 8.6*   GFR: Estimated Creatinine Clearance: 143.8 mL/min (by C-G formula based on SCr of 0.98 mg/dL). Liver Function Tests: Recent Labs  Lab 07/24/19 1120 07/25/19 0534  AST 25 21  ALT 55* 42  ALKPHOS 68 58  BILITOT 1.1 1.0  PROT 8.9* 6.7  ALBUMIN 4.0 3.4*   Recent Labs  Lab 07/24/19 1120  LIPASE 21   No results for input(s): AMMONIA in the last 168 hours. Coagulation Profile: No results for input(s): INR, PROTIME in the last 168 hours. Cardiac Enzymes: No results for input(s): CKTOTAL, CKMB, CKMBINDEX, TROPONINI in the last 168 hours. BNP (last 3 results) No results for  input(s): PROBNP in the last 8760 hours. HbA1C: No results for input(s): HGBA1C in the last 72 hours. CBG: No results for input(s): GLUCAP in the last 168 hours. Lipid Profile: No results for input(s): CHOL, HDL, LDLCALC, TRIG, CHOLHDL, LDLDIRECT in the last 72 hours. Thyroid Function Tests: No results for input(s): TSH, T4TOTAL, FREET4, T3FREE, THYROIDAB in the last 72 hours. Anemia Panel: No results for input(s): VITAMINB12, FOLATE, FERRITIN, TIBC, IRON, RETICCTPCT in the last 72 hours. Sepsis Labs: No results for input(s):  PROCALCITON, LATICACIDVEN in the last 168 hours.  Recent Results (from the past 240 hour(s))  Group A Strep by PCR     Status: None   Collection Time: 07/24/19  3:58 PM   Specimen: Throat; Sterile Swab  Result Value Ref Range Status   Group A Strep by PCR NOT DETECTED NOT DETECTED Final    Comment: Performed at [facility], [address] [place], [place] [address]  SARS CORONAVIRUS 2 (TAT 6-24 HRS) Nasopharyngeal Nasopharyngeal Swab     Status: None   Collection Time: 07/24/19 10:07 PM   Specimen: Nasopharyngeal Swab  Result Value Ref Range Status   SARS Coronavirus 2 NEGATIVE NEGATIVE Final    Comment: (NOTE) SARS-CoV-2 target nucleic acids are NOT DETECTED. The SARS-CoV-2 RNA is generally detectable in upper and lower respiratory specimens during the acute phase of infection. Negative results do not preclude SARS-CoV-2 infection, do not rule out co-infections with other pathogens, and should not be used as the sole basis for treatment or other patient management decisions. Negative results must be combined with clinical observations, patient history, and epidemiological information. The expected result is Negative. Fact Sheet for Patients: HairSlick.no Fact Sheet for Healthcare Providers: quierodirigir.com This test is not yet approved or cleared by the [place] FDA and  has been authorized for detection and/or diagnosis of SARS-CoV-2 by FDA under an Emergency Use Authorization (EUA). This EUA will remain  in effect (meaning this test can be used) for the duration of the COVID-19 declaration under Section 56 4(b)(1) of the Act, 21 U.S.C. section 360bbb-3(b)(1), unless the authorization is terminated or revoked sooner. Performed at [facility], [address] [place], [place] [address]      Radiology Studies: CT Angio Chest PE W and/or Wo Contrast  Result Date: 07/24/2019 CLINICAL DATA:  29 year old  male with hypoxia. EXAM: CT ANGIOGRAPHY CHEST WITH CONTRAST TECHNIQUE: Multidetector CT imaging of the chest was performed using the standard protocol during bolus administration of intravenous contrast. Multiplanar CT image reconstructions and MIPs were obtained to evaluate the vascular anatomy. CONTRAST:  OMNIPAQUE IOHEXOL 350 MG/ML SOLN COMPARISON:  Chest radiograph dated 07/24/2019. FINDINGS: Cardiovascular: There is no cardiomegaly or pericardial effusion. The thoracic aorta is unremarkable. Evaluation of the pulmonary arteries is somewhat limited due to suboptimal opacification. No definite pulmonary artery embolus identified. Mediastinum/Nodes: There is no hilar or mediastinal adenopathy. The esophagus is grossly unremarkable. No mediastinal fluid collection. Lungs/Pleura: The lungs are clear. There is no pleural effusion or pneumothorax. The central airways are patent. Upper Abdomen: Fatty liver. Musculoskeletal: No chest wall abnormality. No acute or significant osseous findings. Review of the MIP images confirms the above findings. IMPRESSION: 1. No acute intrathoracic pathology. No pulmonary artery embolus identified. 2. Fatty liver. Electronically Signed   By: Elgie Collard M.D.   On: 07/24/2019 21:23   DG Chest Portable 1 View  Result Date: 07/24/2019 CLINICAL DATA:  COVID exposure.  Cold-like symptoms for 2 weeks. EXAM: PORTABLE  CHEST 1 VIEW COMPARISON:  04/11/2013 FINDINGS: Normal heart, mediastinum and hila. Clear lungs.  No pleural effusion or pneumothorax. Skeletal structures are grossly unremarkable. IMPRESSION: No active disease. Electronically Signed   By: Lajean Manes M.D.   On: 07/24/2019 16:12    Scheduled Meds: Continuous Infusions: . famotidine (PEPCID) IV 20 mg (07/25/19 0940)     LOS: 0 days   Marylu Lund, MD [facility] Pager On Amion  If 7PM-7AM, please contact night-coverage 07/25/2019, 6:24 PM

## 2019-07-25 NOTE — Plan of Care (Signed)
  Problem: Education: Goal: Knowledge of General Education information will improve Description Including pain rating scale, medication(s)/side effects and non-pharmacologic comfort measures Outcome: Progressing   

## 2019-07-25 NOTE — Plan of Care (Signed)
  Problem: Education: Goal: Knowledge of General Education information will improve Description: Including pain rating scale, medication(s)/side effects and non-pharmacologic comfort measures Outcome: Progressing   Problem: Clinical Measurements: Goal: Ability to maintain clinical measurements within normal limits will improve Outcome: Progressing Goal: Will remain free from infection Outcome: Progressing   Problem: Nutrition: Goal: Adequate nutrition will be maintained Outcome: Progressing   Problem: Pain Managment: Goal: General experience of comfort will improve Outcome: Progressing   Problem: Safety: Goal: Ability to remain free from injury will improve Outcome: Progressing

## 2019-07-26 NOTE — Discharge Summary (Signed)
Physician Discharge Summary  Nathaniel Clark OIN:[id number] DOB: Feb 18, 1991 DOA: 07/24/2019  PCP: Patient, No Pcp Per  Admit date: 07/24/2019 Discharge date: 07/26/2019  Patient Left Against Medical Advice  Brief/Interim Summary: 29yo with no significant past medical hx presented with 2 days of cough, nausea, vomiting, abd pain, diarrhea, sinus congestion, generalized malaise, joint pains. Pt works with co-worker recently diagnosed with COVID. All employees at workplace including co-worker diagnosed with [place] wore masks.  Discharge Diagnoses:  Principal Problem:   Viral gastroenteritis  1. Viral syndrome with intractable n/v/d 1. COVID neg, flu neg 2. Constellation of symptoms are highly suggestive of viral illness 3. Pt was eventually able to tolerate PO intake without emesis 4. Patient still reported marked 10/10 pains involving abd as well as headaches requiring IV toradol 5. Overnight, despite symptoms, patient elected to leave against medical advice. Pt was aware of risk of leaving 2. Obesity 1. BMI 31 2. Recommend diet/lifestyle modification  Discharge Instructions   Allergies as of 07/25/2019      Reactions   Pork-derived Products Other (See Comments)   Religious reasons      Medication List    ASK your doctor about these medications   butalbital-acetaminophen-caffeine 50-325-40 MG tablet Commonly known as: FIORICET Take 1 tablet by mouth every 6 (six) hours as needed for headache.   fluticasone 50 MCG/ACT nasal spray Commonly known as: FLONASE Place 2 sprays into both nostrils daily.   ibuprofen 800 MG tablet Commonly known as: ADVIL Take 800 mg by mouth 3 (three) times daily.   Mucinex 600 MG 12 hr tablet Generic drug: guaiFENesin Take 600 mg by mouth 2 (two) times daily.       Allergies  Allergen Reactions  . Pork-Derived Products Other (See Comments)    Religious reasons   Procedures/Studies: CT Angio Chest PE W and/or Wo Contrast  Result Date:  07/24/2019 CLINICAL DATA:  29 year old male with hypoxia. EXAM: CT ANGIOGRAPHY CHEST WITH CONTRAST TECHNIQUE: Multidetector CT imaging of the chest was performed using the standard protocol during bolus administration of intravenous contrast. Multiplanar CT image reconstructions and MIPs were obtained to evaluate the vascular anatomy. CONTRAST:  167mL OMNIPAQUE IOHEXOL 350 MG/ML SOLN COMPARISON:  Chest radiograph dated 07/24/2019. FINDINGS: Cardiovascular: There is no cardiomegaly or pericardial effusion. The thoracic aorta is unremarkable. Evaluation of the pulmonary arteries is somewhat limited due to suboptimal opacification. No definite pulmonary artery embolus identified. Mediastinum/Nodes: There is no hilar or mediastinal adenopathy. The esophagus is grossly unremarkable. No mediastinal fluid collection. Lungs/Pleura: The lungs are clear. There is no pleural effusion or pneumothorax. The central airways are patent. Upper Abdomen: Fatty liver. Musculoskeletal: No chest wall abnormality. No acute or significant osseous findings. Review of the MIP images confirms the above findings. IMPRESSION: 1. No acute intrathoracic pathology. No pulmonary artery embolus identified. 2. Fatty liver. Electronically Signed   By: Anner Crete M.D.   On: 07/24/2019 21:23   DG Chest Portable 1 View  Result Date: 07/24/2019 CLINICAL DATA:  COVID exposure.  Cold-like symptoms for 2 weeks. EXAM: PORTABLE CHEST 1 VIEW COMPARISON:  04/11/2013 FINDINGS: Normal heart, mediastinum and hila. Clear lungs.  No pleural effusion or pneumothorax. Skeletal structures are grossly unremarkable. IMPRESSION: No active disease. Electronically Signed   By: Lajean Manes M.D.   On: 07/24/2019 16:12     Discharge Exam: Vitals:   07/25/19 1300 07/25/19 2043  BP: 108/67 115/70  Pulse: 72 77  Resp: 20 19  Temp: 98.4 F (36.9 C) 97.9 F (36.6  C)  SpO2: 98% 99%   Vitals:   07/25/19 0113 07/25/19 0858 07/25/19 1300 07/25/19 2043  BP:  131/76 125/63 108/67 115/70  Pulse: 77 71 72 77  Resp: 18 20 20 19   Temp: 97.9 F (36.6 C) 98.9 F (37.2 C) 98.4 F (36.9 C) 97.9 F (36.6 C)  TempSrc: Oral Oral Oral Oral  SpO2: 97% 95% 98% 99%  Weight:      Height:         The results of significant diagnostics from this hospitalization (including imaging, microbiology, ancillary and laboratory) are listed below for reference.     Microbiology: Recent Results (from the past 240 hour(s))  Group A Strep by PCR     Status: None   Collection Time: 07/24/19  3:58 PM   Specimen: Throat; Sterile Swab  Result Value Ref Range Status   Group A Strep by PCR NOT DETECTED NOT DETECTED Final    Comment: Performed at [facility], [address] [place], [place]  SARS CORONAVIRUS 2 (TAT 6-24 HRS) Nasopharyngeal Nasopharyngeal Swab     Status: None   Collection Time: 07/24/19 10:07 PM   Specimen: Nasopharyngeal Swab  Result Value Ref Range Status   SARS Coronavirus 2 NEGATIVE NEGATIVE Final    Comment: (NOTE) SARS-CoV-2 target nucleic acids are NOT DETECTED. The SARS-CoV-2 RNA is generally detectable in upper and lower respiratory specimens during the acute phase of infection. Negative results do not preclude SARS-CoV-2 infection, do not rule out co-infections with other pathogens, and should not be used as the sole basis for treatment or other patient management decisions. Negative results must be combined with clinical observations, patient history, and epidemiological information. The expected result is Negative. Fact Sheet for Patients: 07/26/19 Fact Sheet for Healthcare Providers: HairSlick.no This test is not yet approved or cleared by the [place] FDA and  has been authorized for detection and/or diagnosis of SARS-CoV-2 by FDA under an Emergency Use Authorization (EUA). This EUA will remain  in effect (meaning this test can be used) for the  duration of the COVID-19 declaration under Section 56 4(b)(1) of the Act, 21 U.S.C. section 360bbb-3(b)(1), unless the authorization is terminated or revoked sooner. Performed at [facility], [address], [place], [place]      Labs: BNP (last 3 results) No results for input(s): BNP in the last 8760 hours. Basic Metabolic Panel: Recent Labs  Lab 07/24/19 1120 07/25/19 0534  NA 137 138  K 3.8 3.5  CL 102 103  CO2 26 26  GLUCOSE 118* 97  BUN 5* 7  CREATININE 1.14 0.98  CALCIUM 9.2 8.6*   Liver Function Tests: Recent Labs  Lab 07/24/19 1120 07/25/19 0534  AST 25 21  ALT 55* 42  ALKPHOS 68 58  BILITOT 1.1 1.0  PROT 8.9* 6.7  ALBUMIN 4.0 3.4*   Recent Labs  Lab 07/24/19 1120  LIPASE 21   No results for input(s): AMMONIA in the last 168 hours. CBC: Recent Labs  Lab 07/24/19 1120 07/25/19 0534  WBC 7.5 5.4  NEUTROABS  --  2.2  HGB 17.2* 15.4  HCT 49.7 44.1  MCV 87.7 87.7  PLT 217 168   Cardiac Enzymes: No results for input(s): CKTOTAL, CKMB, CKMBINDEX, TROPONINI in the last 168 hours. BNP: Invalid input(s): POCBNP CBG: No results for input(s): GLUCAP in the last 168 hours. D-Dimer No results for input(s): DDIMER in the last 72 hours. Hgb A1c No results for input(s): HGBA1C  in the last 72 hours. Lipid Profile No results for input(s): CHOL, HDL, LDLCALC, TRIG, CHOLHDL, LDLDIRECT in the last 72 hours. Thyroid function studies No results for input(s): TSH, T4TOTAL, T3FREE, THYROIDAB in the last 72 hours.  Invalid input(s): FREET3 Anemia work up No results for input(s): VITAMINB12, FOLATE, FERRITIN, TIBC, IRON, RETICCTPCT in the last 72 hours. Urinalysis    Component Value Date/Time   COLORURINE YELLOW 07/24/2019 1615   APPEARANCEUR CLEAR 07/24/2019 1615   LABSPEC 1.020 07/24/2019 1615   PHURINE 7.0 07/24/2019 1615   GLUCOSEU NEGATIVE 07/24/2019 1615   HGBUR MODERATE (A) 07/24/2019 1615   BILIRUBINUR NEGATIVE 07/24/2019 1615    KETONESUR NEGATIVE 07/24/2019 1615   PROTEINUR 30 (A) 07/24/2019 1615   NITRITE NEGATIVE 07/24/2019 1615   LEUKOCYTESUR NEGATIVE 07/24/2019 1615   Sepsis Labs Invalid input(s): PROCALCITONIN,  WBC,  LACTICIDVEN Microbiology Recent Results (from the past 240 hour(s))  Group A Strep by PCR     Status: None   Collection Time: 07/24/19  3:58 PM   Specimen: Throat; Sterile Swab  Result Value Ref Range Status   Group A Strep by PCR NOT DETECTED NOT DETECTED Final    Comment: Performed at [facility], [address] [place], [place] [address]  SARS CORONAVIRUS 2 (TAT 6-24 HRS) Nasopharyngeal Nasopharyngeal Swab     Status: None   Collection Time: 07/24/19 10:07 PM   Specimen: Nasopharyngeal Swab  Result Value Ref Range Status   SARS Coronavirus 2 NEGATIVE NEGATIVE Final    Comment: (NOTE) SARS-CoV-2 target nucleic acids are NOT DETECTED. The SARS-CoV-2 RNA is generally detectable in upper and lower respiratory specimens during the acute phase of infection. Negative results do not preclude SARS-CoV-2 infection, do not rule out co-infections with other pathogens, and should not be used as the sole basis for treatment or other patient management decisions. Negative results must be combined with clinical observations, patient history, and epidemiological information. The expected result is Negative. Fact Sheet for Patients: HairSlick.no Fact Sheet for Healthcare Providers: quierodirigir.com This test is not yet approved or cleared by the [place] FDA and  has been authorized for detection and/or diagnosis of SARS-CoV-2 by FDA under an Emergency Use Authorization (EUA). This EUA will remain  in effect (meaning this test can be used) for the duration of the COVID-19 declaration under Section 56 4(b)(1) of the Act, 21 U.S.C. section 360bbb-3(b)(1), unless the authorization is terminated or revoked sooner. Performed at [facility], [address], [place], [place] [address]      SIGNED:   Rickey Barbara, MD  [facility] 07/26/2019, 11:10 AM  If 7PM-7AM, please contact night-coverage

## 2019-07-26 NOTE — Progress Notes (Signed)
Pt left ama- explained some of the consequences- staff attempted to arrange a discharge- no physician availabe at this time. Explained to pt a physician would be in the morning to discharge early.

## 2020-02-03 ENCOUNTER — Emergency Department
Admission: EM | Admit: 2020-02-03 | Discharge: 2020-02-03 | Discharge status: Absent without leave | Payer: Commercial Managed Care - PPO | Source: Home / Self Care

## 2020-02-03 ENCOUNTER — Emergency Department: Payer: Commercial Managed Care - PPO

## 2020-02-03 ENCOUNTER — Emergency Department
Admission: EM | Admit: 2020-02-03 | Discharge: 2020-02-04 | Disposition: A | Discharge status: Home / Self Care | Payer: Commercial Managed Care - PPO | Attending: Emergency Medicine | Admitting: Emergency Medicine

## 2020-02-03 ENCOUNTER — Other Ambulatory Visit: Payer: Self-pay

## 2020-02-03 DIAGNOSIS — R509 Fever, unspecified: Secondary | ICD-10-CM | POA: Diagnosis present

## 2020-02-03 DIAGNOSIS — Z5321 Procedure and treatment not carried out due to patient leaving prior to being seen by health care provider: Secondary | ICD-10-CM | POA: Insufficient documentation

## 2020-02-03 DIAGNOSIS — U071 COVID-19: Secondary | ICD-10-CM | POA: Insufficient documentation

## 2020-02-03 LAB — BASIC METABOLIC PANEL
Anion gap: 10 (ref 5–15)
BUN: 13 mg/dL (ref 6–20)
CO2: 25 mmol/L (ref 22–32)
Calcium: 9 mg/dL (ref 8.9–10.3)
Chloride: 102 mmol/L (ref 98–111)
Creatinine, Ser: 1.09 mg/dL (ref 0.61–1.24)
GFR calc non Af Amer: >60 mL/min (ref >60)
Glucose, Bld: 95 mg/dL (ref 70–99)
Potassium: 3.4 mmol/L — ABNORMAL LOW (ref 3.5–5.1)
Sodium: 137 mmol/L (ref 135–145)

## 2020-02-03 LAB — CBC
HCT: 43.9 % (ref 39.0–52.0)
Hemoglobin: 15.9 g/dL (ref 13.0–17.0)
MCH: 30.9 pg (ref 26.0–34.0)
MCHC: 36.2 g/dL — ABNORMAL HIGH (ref 30.0–36.0)
MCV: 85.2 fL (ref 80.0–100.0)
Platelets: 170 10*3/uL (ref 150–400)
RBC: 5.15 MIL/uL (ref 4.22–5.81)
RDW: 12.1 % (ref 11.5–15.5)
WBC: 4.5 10*3/uL (ref 4.0–10.5)
nRBC: 0 % (ref 0.0–0.2)

## 2020-02-03 LAB — TROPONIN I (HIGH SENSITIVITY): Troponin I (High Sensitivity): 2 ng/L (ref <18)

## 2020-02-03 NOTE — ED Triage Notes (Signed)
Pt to ED via POV c/o fever, cough, sore throat and body aches. Covid exposure on Wednesday.

## 2020-02-03 NOTE — ED Notes (Signed)
Pt states he is now light headed.

## 2020-02-04 ENCOUNTER — Telehealth: Payer: Self-pay | Admitting: Emergency Medicine

## 2020-02-04 LAB — RESPIRATORY PANEL BY RT PCR (FLU A&B, COVID)
Influenza A by PCR: NEGATIVE
Influenza B by PCR: NEGATIVE
SARS Coronavirus 2 by RT PCR: POSITIVE — AB

## 2020-02-04 NOTE — Telephone Encounter (Signed)
Called patient due to lwot to inquire about condition and follow up plans. Was tested for covid in triage and has positive result.  left message

## 2020-02-05 ENCOUNTER — Emergency Department (HOSPITAL_COMMUNITY)
Admission: EM | Admit: 2020-02-05 | Discharge: 2020-02-05 | Disposition: A | Discharge status: Home / Self Care | Payer: Commercial Managed Care - PPO | Attending: Emergency Medicine | Admitting: Emergency Medicine

## 2020-02-05 ENCOUNTER — Other Ambulatory Visit: Payer: Self-pay

## 2020-02-05 ENCOUNTER — Telehealth (HOSPITAL_COMMUNITY): Payer: Self-pay | Admitting: Adult Health

## 2020-02-05 ENCOUNTER — Encounter (HOSPITAL_COMMUNITY): Payer: Self-pay

## 2020-02-05 DIAGNOSIS — F17290 Nicotine dependence, other tobacco product, uncomplicated: Secondary | ICD-10-CM | POA: Insufficient documentation

## 2020-02-05 DIAGNOSIS — R519 Headache, unspecified: Secondary | ICD-10-CM | POA: Diagnosis present

## 2020-02-05 DIAGNOSIS — U071 COVID-19: Secondary | ICD-10-CM | POA: Diagnosis not present

## 2020-02-05 MED ORDER — ACETAMINOPHEN 325 MG PO TABS
650.0000 mg | ORAL_TABLET | Freq: Once | ORAL | Status: AC
  Administered 2020-02-05: 650 mg via ORAL
  Filled 2020-02-05: qty 2

## 2020-02-05 NOTE — Telephone Encounter (Signed)
Called and LMOM regarding monoclonal antibody treatment for COVID 19 given to those who are at risk for complications and/or hospitalization of the virus.  Patient meets criteria based on: BMI greater than 25  Call back number given: [phone]  My chart message: sent  Levy Wellman, NP  

## 2020-02-05 NOTE — ED Provider Notes (Signed)
[facility]-EMERGENCY DEPT Provider Note   CSN: [id number] Arrival date & time: 02/05/20  0109     History Chief Complaint  Patient presents with  . Fever  . Headache    Nathaniel Clark is a 29 y.o. male.  HPI 29 year old male with history of appendectomy presents to the ER with complaints of headaches, fevers, body aches, weakness, nausea, but no vomiting, diarrhea for 2 days.  Patient is a is not vaccinated for Covid.  He states he and his fiance tested positive yesterday.  Patient was tested at [facility] per chart review.  He states he feels dehydrated.  Denies any chest pain, shortness of breath, but does endorse a nonbloody productive cough.  Denies any abdominal pain, dysuria.    History reviewed. No pertinent past medical history.  Patient Active Problem List   Diagnosis Date Noted  . Viral gastroenteritis 07/24/2019    Past Surgical History:  Procedure Laterality Date  . APPENDECTOMY         History reviewed. No pertinent family history.  Social History   Tobacco Use  . Smoking status: Light Tobacco Smoker    Types: Cigars  . Smokeless tobacco: Never Used  Substance Use Topics  . Alcohol use: Not Currently    Comment: "hardly any"   . Drug use: No    Home Medications Prior to Admission medications   Medication Sig Start Date End Date Taking? Authorizing Provider  butalbital-acetaminophen-caffeine (FIORICET) 50-325-40 MG tablet Take 1 tablet by mouth every 6 (six) hours as needed for headache. Patient not taking: Reported on 07/24/2019 04/24/19 04/23/20  Darci Current, MD  fluticasone [facility]) 50 MCG/ACT nasal spray Place 2 sprays into both nostrils daily.    [provider]  guaiFENesin (MUCINEX) 600 MG 12 hr tablet Take 600 mg by mouth 2 (two) times daily.    [provider]  ibuprofen (ADVIL) 800 MG tablet Take 800 mg by mouth 3 (three) times daily. 07/23/19   [provider]    Allergies      Pork-derived products  Review of Systems   Review of Systems  Constitutional: Positive for activity change, appetite change, chills, fatigue and fever.  Respiratory: Positive for cough. Negative for shortness of breath.   Cardiovascular: Negative for chest pain.  Gastrointestinal: Positive for diarrhea, nausea and vomiting. Negative for abdominal pain.  Genitourinary: Negative for dysuria.  Neurological: Positive for weakness.    Physical Exam Updated Vital Signs BP 136/71   Pulse 95   Temp (!) 101 F (38.3 C) (Oral)   Resp 16   SpO2 94%   Physical Exam Vitals and nursing note reviewed.  Constitutional:      General: He is not in acute distress.    Appearance: He is well-developed. He is not ill-appearing, toxic-appearing or diaphoretic.  HENT:     Head: Normocephalic and atraumatic.  Eyes:     Conjunctiva/sclera: Conjunctivae normal.  Cardiovascular:     Rate and Rhythm: Normal rate and regular rhythm.     Heart sounds: Normal heart sounds. No murmur heard.   Pulmonary:     Effort: Pulmonary effort is normal. No respiratory distress.     Breath sounds: Normal breath sounds. No wheezing, rhonchi or rales.  Chest:     Chest wall: No tenderness.  Abdominal:     Palpations: Abdomen is soft.     Tenderness: There is no abdominal tenderness.  Musculoskeletal:        General: Normal range of  motion.     Cervical back: Normal range of motion and neck supple.  Skin:    General: Skin is warm and dry.  Neurological:     Mental Status: He is alert.  Psychiatric:        Mood and Affect: Mood normal.        Behavior: Behavior normal.     ED Results / Procedures / Treatments   Labs (all labs ordered are listed, but only abnormal results are displayed) Labs Reviewed - No data to display  EKG None  Radiology DG Chest 2 View  Result Date: 02/03/2020 CLINICAL DATA:  Fever and cough EXAM: CHEST - 2 VIEW COMPARISON:  07/24/2019 FINDINGS: The heart size and mediastinal  contours are within normal limits. Both lungs are clear. The visualized skeletal structures are unremarkable. IMPRESSION: No acute abnormality noted. Electronically Signed   By: Alcide Clever M.D.   On: 02/03/2020 22:44    Procedures Procedures (including critical care time)  Medications Ordered in ED Medications  acetaminophen (TYLENOL) tablet 650 mg (650 mg Oral Given 02/05/20 7412)    ED Course  I have reviewed the triage vital signs and the nursing notes.  Pertinent labs & imaging results that were available during my care of the patient were reviewed by me and considered in my medical decision making (see chart for details).    MDM Rules/Calculators/A&P                         29 year old male with complaints of Covid symptoms.  He has a positive test as of yesterday.  He is not vaccinated.  He is slightly febrile, however not hypoxic, tachycardic or tachypneic.  He is speaking full sentences without evidence of respiratory distress.  O2 sats with ambulation remained satisfactory.  Low concern for PE at this time.  I discussed options for monoclonal antibody treatment given BMI, however the patient denied treatment.  Return precautions discussed.  Patient was educated on pulse oximeter, and to return to the ER if his oxygen saturations are consistently below 90%.  He voiced understanding and is agreeable.  At this stage in the ED course, the patient has been medically screened and stable for discharge.  Nathaniel Clark was evaluated in Emergency Department on 02/05/2020 for the symptoms described in the history of present illness. He was evaluated in the context of the global COVID-19 pandemic, which necessitated consideration that the patient might be at risk for infection with the SARS-CoV-2 virus that causes COVID-19. Institutional protocols and algorithms that pertain to the evaluation of patients at risk for COVID-19 are in a state of rapid change based on information released by regulatory  bodies including the CDC and federal and state organizations. These policies and algorithms were followed during the patient's care in the ED.  Final Clinical Impression(s) / ED Diagnoses Final diagnoses:  COVID-19    Rx / DC Orders ED Discharge Orders    None       Leone Brand 02/05/20 8786    Lorre Nick, MD 02/09/20 1402

## 2020-02-05 NOTE — ED Triage Notes (Signed)
Tested positive at [facility] 2 days ago, spouse positive as well c/o headache and fever. Denies taking any OTCs today.

## 2020-02-05 NOTE — Discharge Instructions (Addendum)
You were tested for COVID-19 today, make sure to quarantine until you receive the results. If this is positive, please make sure to quarantine additional 7 to 10 days. You may take ibuprofen/Tylenol for body aches and fevers, drink plenty of fluids, over-the-counter cold and flu medications. You may also buy an over-the-counter pulse oximeter which can be found at your local pharmacy. If your oxygen saturation drops below 90%, please make sure to return to the ER. Return to the ER for any worsening shortness of breath.  

## 2020-09-30 ENCOUNTER — Other Ambulatory Visit: Payer: Self-pay | Admitting: Neurology

## 2020-09-30 DIAGNOSIS — G43019 Migraine without aura, intractable, without status migrainosus: Secondary | ICD-10-CM

## 2020-10-13 ENCOUNTER — Ambulatory Visit: Payer: Commercial Managed Care - PPO

## 2020-10-13 ENCOUNTER — Other Ambulatory Visit: Payer: Self-pay

## 2020-10-13 ENCOUNTER — Ambulatory Visit
Admission: RE | Admit: 2020-10-13 | Discharge: 2020-10-13 | Disposition: A | Discharge status: Home / Self Care | Payer: Commercial Managed Care - PPO | Source: Ambulatory Visit | Attending: Neurology | Admitting: Neurology

## 2020-10-13 DIAGNOSIS — G43019 Migraine without aura, intractable, without status migrainosus: Secondary | ICD-10-CM

## 2020-10-13 MED ORDER — IOHEXOL 350 MG/ML SOLN
75.0000 mL | Freq: Once | INTRAVENOUS | Status: AC | PRN
  Administered 2020-10-13: 75 mL via INTRAVENOUS

## 2021-05-17 ENCOUNTER — Encounter: Payer: Self-pay | Admitting: Unknown Physician Specialty

## 2021-05-20 ENCOUNTER — Encounter: Payer: Self-pay | Admitting: Anesthesiology

## 2021-05-27 NOTE — Discharge Instructions (Signed)
[facility] ENDOSCOPIC SINUS SURGERY  [facility]  What is Functional Endoscopic Sinus Surgery?  The Surgery involves making the natural openings of the sinuses larger by removing the bony partitions that separate the sinuses from the nasal cavity.  The natural sinus lining is preserved as much as possible to allow the sinuses to resume normal function after the surgery.  In some patients nasal polyps (excessively swollen lining of the sinuses) may be removed to relieve obstruction of the sinus openings.  The surgery is performed through the nose using lighted scopes, which eliminates the need for incisions on the face.  A septoplasty is a different procedure which is sometimes performed with sinus surgery.  It involves straightening the boy partition that separates the two sides of your nose.  A crooked or deviated septum may need repair if is obstructing the sinuses or nasal airflow.  Turbinate reduction is also often performed during sinus surgery.  The turbinates are bony proturberances from the side walls of the nose which swell and can obstruct the nose in patients with sinus and allergy problems.  Their size can be surgically reduced to help relieve nasal obstruction.  What Can Sinus Surgery Do For Me?  Sinus surgery can reduce the frequency of sinus infections requiring antibiotic treatment.  This can provide improvement in nasal congestion, post-nasal drainage, facial pressure and nasal obstruction.  Surgery will NOT prevent you from ever having an infection again, so it usually only for patients who get infections 4 or more times yearly requiring antibiotics, or for infections that do not clear with antibiotics.  It will not cure nasal allergies, so patients with allergies may still require medication to treat their allergies after surgery. Surgery may improve headaches related to sinusitis, however, some people will continue to  require medication to control sinus headaches related to allergies.  Surgery will do nothing for other forms of headache (migraine, tension or cluster).  What Are the Risks of Endoscopic Sinus Surgery?  Current techniques allow surgery to be performed safely with little risk, however, there are rare complications that patients should be aware of.  Because the sinuses are located around the eyes, there is risk of eye injury, including blindness, though again, this would be quite rare. This is usually a result of bleeding behind the eye during surgery, which can effect vision, though there are treatments to protect the vision and prevent permanent injury. More serious complications would include bleeding inside the brain cavity or damage to the brain.This happens when the fluid around the brain leaks out into the sinus cavity.  Again, all of these complications are uncommon, and spinal fluid leaks can be safely managed surgically if they occur.  The most common complication of sinus surgery is bleeding from the nose, which may require packing or cauterization of the nose.  Patients with polyps may experience recurrence of the polyps that would require revision surgery.  Alterations of sense of smell or injury to the tear ducts are also rare complications.   What is the Surgery Like, and what is the Recovery?  The Surgery usually takes a couple of hours to perform, and is usually performed under a general anesthetic (completely asleep).  Patients are usually discharged home after a couple of hours.  Sometimes during surgery it is necessary to pack the nose to control bleeding, and the packing is left in place for 24 - 48 hours, and removed by your surgeon.  If   a septoplasty was performed during the procedure, there is often a splint placed which must be removed after 5-7 days.   Discomfort: Pain is usually mild to moderate, and can be controlled by prescription pain medication or acetaminophen (Tylenol).   Aspirin, Ibuprofen (Advil, Motrin), or Naprosyn (Aleve) should be avoided, as they can cause increased bleeding.  Most patients feel sinus pressure like they have a bad head cold for several days.  Sleeping with your head elevated can help reduce swelling and facial pressure, as can ice packs over the face.  A humidifier may be helpful to keep the mucous and blood from drying in the nose.   Diet: There are no specific diet restrictions, however, you should generally start with clear liquids and a light diet of bland foods because the anesthetic can cause some nausea.  Advance your diet depending on how your stomach feels.  Taking your pain medication with food will often help reduce stomach upset which pain medications can cause.  Nasal Saline Irrigation: It is important to remove blood clots and dried mucous from the nose as it is healing.  This is done by having you irrigate the nose at least 3 - 4 times daily with a salt water solution.  We recommend using NeilMed Sinus Rinse (available at the drug store).  Fill the squeeze bottle with the solution, bend over a sink, and insert the tip of the squeeze bottle into the nose  of an inch.  Point the tip of the squeeze bottle towards the inside corner of the eye on the same side your irrigating.  Squeeze the bottle and gently irrigate the nose.  If you bend forward as you do this, most of the fluid will flow back out of the nose, instead of down your throat.   The solution should be warm, near body temperature, when you irrigate.   Each time you irrigate, you should use a full squeeze bottle.   Note that if you are instructed to use Nasal Steroid Sprays at any time after your surgery, irrigate with saline BEFORE using the steroid spray, so you do not wash it all out of the nose. Another product, Nasal Saline Gel (such as AYR Nasal Saline Gel) can be applied in each nostril 3 - 4 times daily to moisture the nose and reduce scabbing or crusting.  Bleeding:   Bloody drainage from the nose can be expected for several days, and patients are instructed to irrigate their nose frequently with salt water to help remove mucous and blood clots.  The drainage may be dark red or brown, though some fresh blood may be seen intermittently, especially after irrigation.  Do not blow you nose, as bleeding may occur. If you must sneeze, keep your mouth open to allow air to escape through your mouth.  If heavy bleeding occurs: Irrigate the nose with saline to rinse out clots, then spray the nose 3 - 4 times with Afrin Nasal Decongestant Spray.  The spray will constrict the blood vessels to slow bleeding.  Pinch the lower half of your nose shut to apply pressure, and lay down with your head elevated.  Ice packs over the nose may help as well. If bleeding persists despite these measures, you should notify your doctor.  Do not use the Afrin routinely to control nasal congestion after surgery, as it can result in worsening congestion and may affect healing.     Activity: Return to work varies among patients. Most patients will be out   of work at least 5 - 7 days to recover.  Patient may return to work after they are off of narcotic pain medication, and feeling well enough to perform the functions of their job.  Patients must avoid heavy lifting (over 10 pounds) or strenuous physical for 2 weeks after surgery, so your employer may need to assign you to light duty, or keep you out of work longer if light duty is not possible.  NOTE: you should not drive, operate dangerous machinery, do any mentally demanding tasks or make any important legal or financial decisions while on narcotic pain medication and recovering from the general anesthetic.    Call Your Doctor Immediately if You Have Any of the Following: Bleeding that you cannot control with the above measures Loss of vision, double vision, bulging of the eye or black eyes. Fever over 101 degrees Neck stiffness with severe headache,  fever, nausea and change in mental state. You are always encouraged to call anytime with concerns, however, please call with requests for pain medication refills during office hours.  Office Endoscopy: During follow-up visits your doctor will remove any packing or splints that may have been placed and evaluate and clean your sinuses endoscopically.  Topical anesthetic will be used to make this as comfortable as possible, though you may want to take your pain medication prior to the visit.  How often this will need to be done varies from patient to patient.  After complete recovery from the surgery, you may need follow-up endoscopy from time to time, particularly if there is concern of recurrent infection or nasal polyps.  

## 2021-06-03 ENCOUNTER — Encounter: Admission: RE | Payer: Self-pay | Source: Home / Self Care

## 2021-06-03 ENCOUNTER — Ambulatory Visit
Admission: RE | Admit: 2021-06-03 | Payer: Commercial Managed Care - PPO | Source: Home / Self Care | Admitting: Unknown Physician Specialty

## 2021-06-03 HISTORY — DX: Migraine, unspecified, not intractable, without status migrainosus: G43.909

## 2021-06-03 SURGERY — SINUS SURGERY, WITH IMAGING GUIDANCE
Anesthesia: General

## 2021-10-02 IMAGING — CT CT ANGIO NECK
1 of 16 series · 2 of 33 positions shown · IV contrast (omnipaque)
Comparison: Head CTA 04/24/2019

CLINICAL DATA: Worsening migraines with pain extending into the
neck.

EXAM:
CT ANGIOGRAPHY HEAD AND NECK
TECHNIQUE: Multidetector CT imaging of the head and neck was performed using
the standard protocol during bolus administration of intravenous
contrast. Multiplanar CT image reconstructions and MIPs were
obtained to evaluate the vascular anatomy. Carotid stenosis
measurements (when applicable) are obtained utilizing NASCET
criteria, using the distal internal carotid diameter as the
denominator.
CONTRAST:  75mL OMNIPAQUE IOHEXOL 350 MG/ML SOLN

[Series 15: ax thin mips cta head & neck 1.00 · axial · 0.57mm/px · z∈[-742,-377]mm · 2 of 366 slices shown]
[im 1/366  soft-tissue]
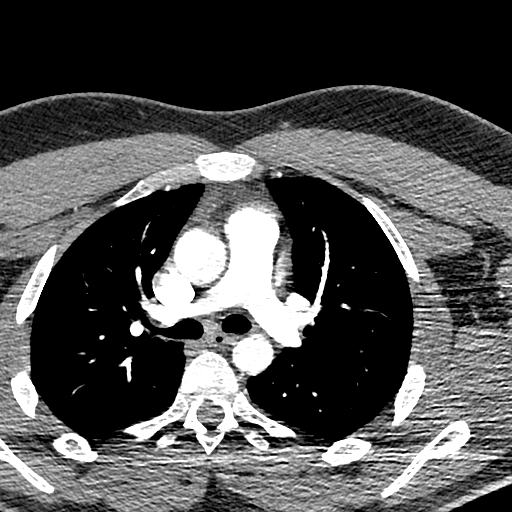
[im 366/366  bone]
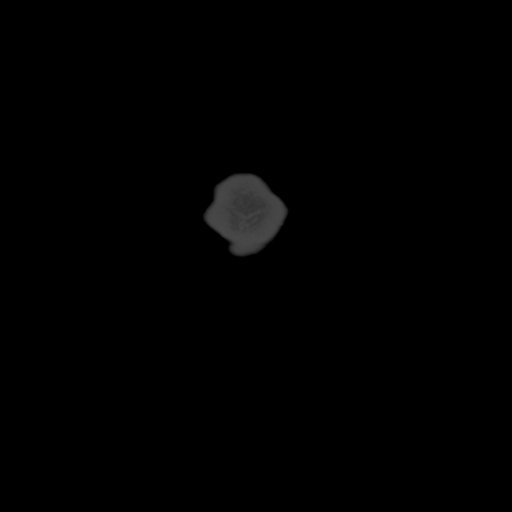

[2 of 33 positions shown; findings below may reference images not displayed]

FINDINGS: CT HEAD FINDINGS

A noncontrast CT was not acquired.

CTA NECK FINDINGS

Aortic arch: Normal. Three vessel branching but 1 of these vessels
is the left vertebral artery due to single common carotid origin

Right carotid system: Vessels are smooth and widely patent. Mild ICA
tortuosity. No atheromatous changes.

Left carotid system: Vessels are smooth and widely patent. Mild ICA
tortuosity. No atheromatous changes.

Vertebral arteries: No proximal subclavian stenosis. Left vertebral
artery arises from the arch. Vertebral arteries are codominant and
smoothly contoured with wide patency to the dura.

Skeleton: Negative

Other neck: No inflammation or mass is seen in the neck. Patchy
opacification of frontal and ethmoid sinuses, also seen in 8383.

Upper chest: Negative

Review of the MIP images confirms the above findings

CTA HEAD FINDINGS

Anterior circulation: Vessels are smooth and widely patent. No
aneurysm or stenosis.

Posterior circulation: The vertebral and basilar arteries are smooth
and widely patent. No branch occlusion, beading, or aneurysm. No
atheromatous changes

Venous sinuses: Negative

Anatomic variants: None significant

Review of the MIP images confirms the above findings
IMPRESSION: 1. Normal CTA of the head and neck.
2. Chronic frontal ethmoid sinusitis.

## 2022-01-06 ENCOUNTER — Emergency Department
Admission: EM | Admit: 2022-01-06 | Discharge: 2022-01-06 | Disposition: A | Discharge status: Home / Self Care | Payer: Commercial Managed Care - PPO | Attending: Emergency Medicine | Admitting: Emergency Medicine

## 2022-01-06 ENCOUNTER — Other Ambulatory Visit: Payer: Self-pay

## 2022-01-06 ENCOUNTER — Encounter: Payer: Self-pay | Admitting: Intensive Care

## 2022-01-06 ENCOUNTER — Emergency Department: Payer: Commercial Managed Care - PPO

## 2022-01-06 DIAGNOSIS — R0781 Pleurodynia: Secondary | ICD-10-CM | POA: Diagnosis not present

## 2022-01-06 DIAGNOSIS — J069 Acute upper respiratory infection, unspecified: Secondary | ICD-10-CM | POA: Diagnosis not present

## 2022-01-06 DIAGNOSIS — Z20822 Contact with and (suspected) exposure to covid-19: Secondary | ICD-10-CM | POA: Insufficient documentation

## 2022-01-06 DIAGNOSIS — M5412 Radiculopathy, cervical region: Secondary | ICD-10-CM | POA: Diagnosis not present

## 2022-01-06 DIAGNOSIS — R059 Cough, unspecified: Secondary | ICD-10-CM | POA: Diagnosis present

## 2022-01-06 LAB — RESP PANEL BY RT-PCR (FLU A&B, COVID) ARPGX2
Influenza A by PCR: NEGATIVE
Influenza B by PCR: NEGATIVE
SARS Coronavirus 2 by RT PCR: NEGATIVE

## 2022-01-06 MED ORDER — GUAIFENESIN 100 MG/5ML PO LIQD
10.0000 mL | ORAL | Status: AC
  Administered 2022-01-06: 10 mL via ORAL
  Filled 2022-01-06: qty 10

## 2022-01-06 MED ORDER — MELOXICAM 15 MG PO TABS: 15.0000 mg | ORAL_TABLET | Freq: Every day | ORAL | 2 refills | Status: AC

## 2022-01-06 MED ORDER — NAPROXEN 500 MG PO TABS
500.0000 mg | ORAL_TABLET | Freq: Once | ORAL | Status: AC
  Administered 2022-01-06: 500 mg via ORAL
  Filled 2022-01-06: qty 1

## 2022-01-06 MED ORDER — GUAIFENESIN-CODEINE 100-10 MG/5ML PO SYRP: 5.0000 mL | ORAL_SOLUTION | Freq: Three times a day (TID) | ORAL | 0 refills | Status: DC | PRN

## 2022-01-06 NOTE — ED Notes (Signed)
See triage note  Presents with cough,slight SOB and some lower back pain  Denies any fever today  but did have fever last week

## 2022-01-06 NOTE — ED Notes (Signed)
Patient requested something for a cough

## 2022-01-06 NOTE — Discharge Instructions (Addendum)
Your COVID and influenza test is negative.  Your chest x-ray does not show any indication of pneumonia.  For your continued neck pain, please call and schedule a follow-up with primary care.  Call [facility] and asked to be connected to someone who is accepting new patients.  The cough medication may make you drowsy or sleepy.  Do not take it if you need to work or drive.  If you develop new symptoms of concern and you are unable to be evaluated by primary care, please return to the emergency department.

## 2022-01-06 NOTE — ED Triage Notes (Signed)
Patient c/o fever that started a week ago and has now subsided. Symptoms today are cough and lower right back pain.

## 2022-01-06 NOTE — ED Provider Notes (Signed)
[facility] Provider Note    Event Date/Time   First MD Initiated Contact with Patient 01/06/22 1428     (approximate)   History   Cough and Back Pain   HPI  Nathaniel Clark is a 31 y.o. male with history of migraine and as listed in EMR presents to the emergency department for evaluation of cough, right lower/lateral rib pain, and chronic neck pain that radiates up into the back of his head.  Cough not resolved by Jerilynn Som.  No known fever..      Physical Exam   Triage Vital Signs: ED Triage Vitals  Enc Vitals Group     BP 01/06/22 1341 123/78     Pulse Rate 01/06/22 1341 98     Resp 01/06/22 1341 16     Temp 01/06/22 1341 98.3 F (36.8 C)     Temp Source 01/06/22 1341 Oral     SpO2 01/06/22 1341 97 %     Weight 01/06/22 1342 260 lb (117.9 kg)     Height 01/06/22 1342 6' (1.829 m)     Head Circumference --      Peak Flow --      Pain Score 01/06/22 1341 2     Pain Loc --      Pain Edu? --      Excl. in GC? --     Most recent vital signs: Vitals:   01/06/22 1341  BP: 123/78  Pulse: 98  Resp: 16  Temp: 98.3 F (36.8 C)  SpO2: 97%    General: Awake, no distress.  CV:  Good peripheral perfusion.  Resp:  Normal effort.  Abd:  No distention.  Other:  Breath sounds clear to auscultation.  No focal tenderness along the cervical spine.   ED Results / Procedures / Treatments   Labs (all labs ordered are listed, but only abnormal results are displayed) Labs Reviewed  RESP PANEL BY RT-PCR (FLU A&B, COVID) [id number]     EKG     RADIOLOGY  Image interpreted and radiology report reviewed by me.  Chest x-ray negative for acute cardiopulmonary abnormality  PROCEDURES:  Critical Care performed: No  Procedures   MEDICATIONS ORDERED IN ED: Medications  guaiFENesin (ROBITUSSIN) 100 MG/5ML liquid 10 mL (10 mLs Oral Given 01/06/22 1714)  naproxen (NAPROSYN) tablet 500 mg (500 mg Oral Given 01/06/22 1713)     IMPRESSION /  MDM / ASSESSMENT AND PLAN / ED COURSE   I have reviewed the triage note.  Differential diagnosis includes, but is not limited to, COVID, pneumonia, viral URI, musculoskeletal strain.  31 year old male presenting to the emergency department for treatment and evaluation of 1 week of cough with progressive development of right posterior back/rib pain.  He also has chronic neck pain that radiates up into his head that has not been definitively diagnosed.  He has been evaluated by neurology who put him on Topamax which he did not like taking and has stopped.  Pain in his neck seems to be worse with straining.  Cough is nonproductive and he denies fever or other symptoms of concern.  He was evaluated at urgent care and prescribed Tessalon Perles which have not provided him any relief.  COVID and influenza testing is negative here today.  Chest x-ray does not indicate pneumonia or other cardiopulmonary disease patterns.  Plan will be to treat him with Robitussin-AC for cough and meloxicam and for neck pain.  He was encouraged to call and schedule an  appointment with primary care and will be provided the number for [facility] clinic.  If symptoms change or worsen he is unable to see primary care he is to return to the emergency department.      FINAL CLINICAL IMPRESSION(S) / ED DIAGNOSES   Final diagnoses:  Acute upper respiratory infection  Cervical radicular pain     Rx / DC Orders   ED Discharge Orders          Ordered    guaiFENesin-codeine (ROBITUSSIN AC) 100-10 MG/5ML syrup  3 times daily PRN        01/06/22 1734    meloxicam (MOBIC) 15 MG tablet  Daily        01/06/22 1734             Note:  This document was prepared using Dragon voice recognition software and may include unintentional dictation errors.   Chinita Pester, FNP 01/06/22 Hayden Rasmussen, MD 01/08/22 [phone]

## 2022-09-21 ENCOUNTER — Ambulatory Visit: Payer: Commercial Managed Care - PPO | Admitting: Podiatry

## 2022-09-21 ENCOUNTER — Encounter: Payer: Self-pay | Admitting: Podiatry

## 2022-09-21 VITALS — BP 136/88 | HR 82

## 2022-09-21 DIAGNOSIS — L600 Ingrowing nail: Secondary | ICD-10-CM | POA: Diagnosis not present

## 2022-09-21 NOTE — Progress Notes (Signed)
  Subjective:  Patient ID: Nathaniel Clark, male    DOB: December 04, 1990,  MRN: [id number]  Chief Complaint  Patient presents with   Nail Problem    "I have ingrown toenails on both big toes but the right one is the worse.  I tried to take it out myself."    32 y.o. male presents with the above complaint.  Patient presents with right hallux medial border ingrown pain for touch is progressive gotten worse worse with ambulation worse with pressure he has not seen anyone else prior to seeing me he denies any other acute complaints pain scale 7 out of 10.   Review of Systems: Negative except as noted in the HPI. Denies N/V/F/Ch.  Past Medical History:  Diagnosis Date   Migraines     Current Outpatient Medications:    fluticasone (FLONASE) 50 MCG/ACT nasal spray, Place 2 sprays into both nostrils daily., Disp: , Rfl:    guaiFENesin-codeine (ROBITUSSIN AC) 100-10 MG/5ML syrup, Take 5 mLs by mouth 3 (three) times daily as needed for cough. (Patient not taking: Reported on 09/21/2022), Disp: 120 mL, Rfl: 0   meloxicam (MOBIC) 15 MG tablet, Take 1 tablet (15 mg total) by mouth daily. (Patient not taking: Reported on 09/21/2022), Disp: 30 tablet, Rfl: 2  Social History   Tobacco Use  Smoking Status Former   Types: Cigars  Smokeless Tobacco Never    Allergies  Allergen Reactions   Pork-Derived Products Other (See Comments)    Religious reasons   Objective:   Vitals:   09/21/22 1108  BP: 136/88  Pulse: 82   There is no height or weight on file to calculate BMI. Constitutional Well developed. Well nourished.  Vascular Dorsalis pedis pulses palpable bilaterally. Posterior tibial pulses palpable bilaterally. Capillary refill normal to all digits.  No cyanosis or clubbing noted. Pedal hair growth normal.  Neurologic Normal speech. Oriented to person, place, and time. Epicritic sensation to light touch grossly present bilaterally.  Dermatologic Painful ingrowing nail at medial nail borders  of the hallux nail right. No other open wounds. No skin lesions.  Orthopedic: Normal joint ROM without pain or crepitus bilaterally. No visible deformities. No bony tenderness.   Radiographs: None Assessment:   1. Ingrown toenail of right foot    Plan:  Patient was evaluated and treated and all questions answered.  Ingrown Nail, right -Patient elects to proceed with minor surgery to remove ingrown toenail removal today. Consent reviewed and signed by patient. -Ingrown nail excised. See procedure note. -Educated on post-procedure care including soaking. Written instructions provided and reviewed. -Patient to follow up in 2 weeks for nail check.  Procedure: Excision of Ingrown Toenail Location: Right 1st toe medial nail borders. Anesthesia: Lidocaine 1% plain; 1.5 mL and Marcaine 0.5% plain; 1.5 mL, digital block. Skin Prep: Betadine. Dressing: Silvadene; telfa; dry, sterile, compression dressing. Technique: Following skin prep, the toe was exsanguinated and a tourniquet was secured at the base of the toe. The affected nail border was freed, split with a nail splitter, and excised. Chemical matrixectomy was then performed with phenol and irrigated out with alcohol. The tourniquet was then removed and sterile dressing applied. Disposition: Patient tolerated procedure well. Patient to return in 2 weeks for follow-up.   No follow-ups on file.

## 2022-11-14 ENCOUNTER — Ambulatory Visit: Payer: Commercial Managed Care - PPO | Admitting: Podiatry

## 2023-10-05 ENCOUNTER — Ambulatory Visit: Admitting: Podiatry

## 2023-10-05 ENCOUNTER — Encounter: Payer: Self-pay | Admitting: Podiatry

## 2023-10-05 DIAGNOSIS — L600 Ingrowing nail: Secondary | ICD-10-CM

## 2023-10-05 NOTE — Patient Instructions (Signed)

## 2023-10-05 NOTE — Progress Notes (Signed)
       Subjective:  Patient ID: Nathaniel Clark, male    DOB: 1991/01/28,  MRN: [id number]  Nathaniel Clark presents to clinic today for:  Chief Complaint  Patient presents with   Ingrown Toenail    Rm15 ingrown nail right hallux bilateral borders  Patient presenting for above complaint.  He is concerned about ingrown toenail to the right hallux affecting both borders.  Did have medial border treated previously just over a year ago.  Does have pain, redness, swelling, some dried blood present.  PCP is Patient, No Pcp Per.  Allergies  Allergen Reactions   Pork-Derived Products Other (See Comments)    Religious reasons    Review of Systems: Negative except as noted in the HPI.  Objective:  There were no vitals filed for this visit.  Nathaniel Clark is a pleasant 33 y.o. male in NAD. AAO x 3.  Vascular Examination: Capillary refill time is less than 3 seconds to toes bilateral. Palpable pedal pulses b/l LE. Digital hair present b/l. No pedal edema b/l. Skin temperature gradient WNL b/l. No varicosities b/l. No cyanosis or clubbing noted b/l.   Dermatological Examination: There is incurvation of the right bilateral nail border.  There is pain on palpation of the affected nail borders.  Localized edema, minimal erythema.  Pincer nail deformity noted.  Tenderness on palpation of nail plate dorsally.  Is beginning to complain of some pain to the left hallux, no signs of acute bacterial infection.  Neurological Examination: Protective sensation intact with Semmes-Weinstein 10 gram monofilament b/l LE. Vibratory sensation intact b/l LE.      No data to display           Assessment/Plan: 1. Ingrown toenail of right foot     No orders of the defined types were placed in this encounter.  Procedure: Right hallux total nail avulsion with chemical matrixectomy  Discussed patient's condition today.  Given pincer nail deformity, with both nail borders affected, recommended proceeding with  right hallux total nail avulsion with chemical matrixectomy.  After obtaining patient consent, the right hallux was anesthetized with a 50:50 mixture of 1% lidocaine plain and 0.5% bupivacaine plain for a total of 3cc's administered.  Upon confirmation of anesthesia, a freer elevator was utilized to free the right hallux nail plate from the nail bed.  The nail plate was then avulsed proximal to the eponychium and removed in toto.  The area was inspected for any remaining spicules.  A chemical matrixectomy was performed with phenol and neutralized with isopropyl alcohol solution.  Antibiotic ointment and a DSD were applied, followed by a Coban dressing.  Patient tolerated the anesthetic and procedure well and will f/u in 2-3 weeks for recheck.  Patient given post-procedure instructions for daily 20-minute Epsom salt soaks, antibiotic ointment and daily use of Bandaids until toe starts to dry / form eschar.    Return in about 2 weeks (around 10/19/2023) for Nail Check, Possible left hallux PNA.   Eve Hinders, DPM, AACFAS [facility]     [address] [place], [place] [address]                Office [phone]  Fax [phone]

## 2023-10-19 ENCOUNTER — Ambulatory Visit: Admitting: Podiatry

## 2023-10-19 ENCOUNTER — Encounter: Payer: Self-pay | Admitting: Podiatry

## 2023-10-19 DIAGNOSIS — L600 Ingrowing nail: Secondary | ICD-10-CM | POA: Diagnosis not present

## 2023-10-19 NOTE — Progress Notes (Unsigned)
 Subjective:  Patient ID: Nathaniel Clark, male    DOB: [birth year],  MRN: [id number]  Nathaniel Clark presents to clinic today for:  Chief Complaint  Patient presents with   Ingrown Toenail    Rm8 Nail check right hallux some tenderness/left hallux possible nail removal.  Patient presenting for nail check to right hallux total nail avulsion with chemical matrixectomy.  This appears to be healing well.  Does have some soreness and some mild redness.  He states that the left first toe is beginning to bother him on both borders.  He thinks he would like the same procedure done on this side.  PCP is Patient, No Pcp Per.  Allergies  Allergen Reactions   Pork-Derived Products Other (See Comments)    Religious reasons    Review of Systems: Negative except as noted in the HPI.  Objective:  There were no vitals filed for this visit.  Nathaniel Clark is a pleasant 33 y.o. male in NAD. AAO x 3.  Vascular Examination: Capillary refill time is less than 3 seconds to toes bilateral. Palpable pedal pulses b/l LE. Digital hair present b/l. No pedal edema b/l. Skin temperature gradient WNL b/l. No varicosities b/l. No cyanosis or clubbing noted b/l.   Dermatological Examination: There is incurvation of the left bilateral nail border.  There is pain on palpation of the affected nail borders.  Mild redness present, no other signs of potential acute bacterial infection.  Right hallux toenail avulsion site appears to be healing well.  He has keeping ointment applied.  There are some mild redness to the base of the nailbed and some soreness here.  Neurological Examination: Protective sensation intact with Semmes-Weinstein 10 gram monofilament b/l LE. Vibratory sensation intact b/l LE.      No data to display           Assessment/Plan: 1. Ingrown toenail of right foot   2. Ingrown toenail of left foot     No orders of the defined types were placed in this encounter.  Right hallux toenail  avulsion with chemical matrixectomy appears to be healing well.  Advise discontinuing any ointment.  He should continue to soak and bandage allowing this to air dry before applying bandage until a dry scab starts to form.  He does endorse some pain to the left first toenail bilateral borders.  I did recommend partial nail avulsions of the affected borders of chemical matrixectomy.  He states that he would rather have the entire toenail removed.  We will proceed with this.  Discussed patient's condition today.  After obtaining patient consent, the left was anesthetized with a 50:50 mixture of 1% lidocaine plain and 0.5% bupivacaine plain for a total of 3cc's administered.  Upon confirmation of anesthesia, a freer elevator was utilized to free the left hallux nail plate from the nail bed.  The nail plate was then avulsed proximal to the eponychium and removed in toto.  The area was inspected for any remaining spicules.  A chemical matrixectomy was performed with phenol and neutralized with isopropyl alcohol solution.  Antibiotic ointment and a DSD were applied, followed by a Coban dressing.  Patient tolerated the anesthetic and procedure well and will f/u in 2-3 weeks for recheck.  Patient given post-procedure instructions for daily 20-minute Epsom salt soaks, antibiotic ointment and daily use of Bandaids until toe starts to dry / form eschar.    Return in about 2 weeks (around 11/02/2023) for Nail Check.  Ethan Saddler, DPM, AACFAS [facility]     [address] [place], [place] [address]                Office [phone]  Fax [phone]

## 2023-10-19 NOTE — Patient Instructions (Signed)

## 2023-10-22 ENCOUNTER — Encounter: Payer: Self-pay | Admitting: Podiatry

## 2023-10-30 ENCOUNTER — Encounter: Payer: Self-pay | Admitting: Podiatry

## 2023-10-30 ENCOUNTER — Ambulatory Visit: Admitting: Podiatry

## 2023-10-30 DIAGNOSIS — L600 Ingrowing nail: Secondary | ICD-10-CM

## 2023-10-30 NOTE — Progress Notes (Signed)
 Patient presents follow-up nail surgery left great toe.  No complaints.  Physical exam:  Dermatologic: Nail surgery site for matrixectomy healing well with no signs of infection.  Diagnosis: 1.  Status post matrixectomy toe.  Healing well  Plan: - POV status post nail surgery hallux right if patient has any problems she can call for appointment otherwise we can see her as needed - Return as needed

## 2024-01-11 NOTE — Progress Notes (Addendum)
 Subjective Patient ID: Nathaniel Clark is a 33 y.o. male.    33 year old male presents to clinic complaining of increased thirst over the past 2 weeks.  Patient states that he was at work this morning and sought care at his onsite employment facility and his glucose was 405.  They advised him to be seen and he came to urgent care for evaluation.  He has no other complaints on today's visit.   History provided by:  Patient   Review of Systems  Constitutional:  Negative for chills, fatigue and fever.  HENT:  Negative for congestion, ear discharge, ear pain, rhinorrhea and sore throat.   Respiratory:  Negative for cough, chest tightness, shortness of breath and wheezing.   Gastrointestinal:  Negative for abdominal pain, diarrhea, nausea and vomiting.  Musculoskeletal:  Negative for arthralgias and myalgias.  Neurological:  Negative for weakness, numbness and headaches.    Patient History  Allergies: No Known Allergies  History reviewed. No pertinent past medical history. History reviewed. No pertinent surgical history. Social History   Socioeconomic History  . Marital status: Not on file    Spouse name: Not on file  . Number of children: Not on file  . Years of education: Not on file  . Highest education level: Not on file  Occupational History  . Not on file  Tobacco Use  . Smoking status: Never  . Smokeless tobacco: Never  Substance and Sexual Activity  . Alcohol use: Not on file  . Drug use: Not on file  . Sexual activity: Not on file  Other Topics Concern  . Not on file  Social History Narrative  . Not on file   History reviewed. No pertinent family history. No current outpatient medications on file prior to visit.   No current facility-administered medications on file prior to visit.    Objective  Vitals:   01/11/24 1310  BP: (!) 151/107  Pulse: 98  Resp: 19  Temp: 36.6 C (97.9 F)  TempSrc: Tympanic  SpO2: 98%  Weight: 124 kg  Height: 6'  PainSc:  0-No pain    BP Recheck is 144/92  Physical Exam  GENERAL APPEARANCE: afebrile, non-toxic and not acutely ill-appearing in NAD. Patient is speaking in full sentences with no increased work of breathing. EYES: EOM intact, PERRLA, with no conjunctival injection noted. NOSE: Nasal mucosa is without erythema or discharge.  THROAT: No erythema, swelling, or tonsillary exudate noted bilaterally. No soft palate petechiae noted. Uvula is midline and oral mucosa is intact. NECK: Soft, supple with no cervical adenopathy noted. HEART: RRR, S1 and S2 present with no obvious murmur noted. No rubs, gallops or clicks noted. LUNGS: CTA bilaterally with no wheezing, ronchi, rales or crackles noted.  ABDOMEN: Soft, nontender, nondistended, no hepatomegaly, no splenomegaly, flat with no palpable masses noted. No CVA tenderness noted.  Results for orders placed or performed in visit on 01/11/24  POCT urinalysis dipstick manually resulted  Component Result   Color, UA Yellow   Clarity, UA Clear   Glucose, UA 3+ (A)   Bilirubin, UA Negative   Ketones, UA Negative   Spec Grav, UA 1.015   Blood, UA 2+ (A)   pH, UA 5.5   Protein, UA Negative   Urobilinogen, UA Negative   Leukocytes, UA Negative   Nitrite, UA Negative  POCT glucose manually entry  Component Result   Glucose Blood, POC 362 (A)      Procedures MDM:     1 Acute illness with systemic  symptoms     Explanation of Medical Decision Making and variances from expected care:    Patient's glucose in clinic was 362.  Reviewed diabetic diet with the patient advised him to hydrate well.  Advised that his urinalysis only shows glucose which is consistent with his elevated glucose. Patient is provided with a list of local primary's that are accepting new patients.  Advised him to establish with primary care provider for evaluation and treatment. Reviewed reasons for the patient to seek care at the emergency department he verbalizes  understanding. Follow-up in clinic as needed.    Unique ordered tests: One     Assessment requiring historian other than patient: No     Independent visualization of image, tracing, or test: No     Discussion of management with another provider: No     Risk:: Moderate         Assessment/Plan Diagnoses and all orders for this visit:  Increased thirst -     POCT urinalysis dipstick manually resulted -     POCT glucose manually entry  Elevated glucose -     POCT urinalysis dipstick manually resulted -     POCT glucose manually entry     Disposition Status: Home  Patient Instructions  Advised to establish with PCP soon. Advised on reasons to seek care at the ED and he verbalizes understanding. Advised to check glucose a few times daily and keep a long. F/U as needed.   Progress note signed by Fairy Jenny, PA on 01/11/24 at  1:54 PM

## 2024-01-15 ENCOUNTER — Encounter: Payer: Self-pay | Admitting: Family Medicine

## 2024-01-15 ENCOUNTER — Ambulatory Visit: Admitting: Family Medicine

## 2024-01-15 VITALS — BP 119/82 | HR 82 | Resp 16 | Ht 72.0 in | Wt 269.8 lb

## 2024-01-15 DIAGNOSIS — R0683 Snoring: Secondary | ICD-10-CM

## 2024-01-15 DIAGNOSIS — G4486 Cervicogenic headache: Secondary | ICD-10-CM | POA: Diagnosis not present

## 2024-01-15 DIAGNOSIS — Z7689 Persons encountering health services in other specified circumstances: Secondary | ICD-10-CM

## 2024-01-15 DIAGNOSIS — E1165 Type 2 diabetes mellitus with hyperglycemia: Secondary | ICD-10-CM

## 2024-01-15 DIAGNOSIS — G4730 Sleep apnea, unspecified: Secondary | ICD-10-CM | POA: Insufficient documentation

## 2024-01-15 DIAGNOSIS — R7309 Other abnormal glucose: Secondary | ICD-10-CM

## 2024-01-15 DIAGNOSIS — R81 Glycosuria: Secondary | ICD-10-CM

## 2024-01-15 DIAGNOSIS — R030 Elevated blood-pressure reading, without diagnosis of hypertension: Secondary | ICD-10-CM | POA: Diagnosis not present

## 2024-01-15 LAB — POCT GLYCOSYLATED HEMOGLOBIN (HGB A1C): Hemoglobin A1C: 10.2 % — AB (ref 4.0–5.6)

## 2024-01-15 MED ORDER — LANTUS SOLOSTAR 100 UNIT/ML ~~LOC~~ SOPN: 10.0000 [IU] | PEN_INJECTOR | Freq: Every day | SUBCUTANEOUS | 99 refills | Status: DC

## 2024-01-15 NOTE — Patient Instructions (Addendum)
 LONG ACTING INSULIN INSTRUCTIONS  Check your blood sugar every day. It would be best if you could keep a notebook of dates, blood sugar values, and how much insulin you use. Bring this to your next visit.   If your blood sugar is less than 100, subtract 2 units from the previous day's dose.   If your sugar is between 100-200, you will give yourself the same dose of insulin as the previous day.   If your blood sugar is over 200, you should add 2 units to your previous day's dose.   - For Example: Yesterday you gave yourself 20 units of Insulin. Today your blood sugar is 235. You should give yourself 22 units today. If the next day your sugar is 215, you would give yourself 24 units total.     Sugar    Insulin Dosing  0-100    Subtract 2 units from previous days' dose 101-200   Keep the same dose as the previous day 200 or more   Add 2 units to the previous days dose.      MyChart:  For all urgent or time sensitive needs we ask that you please call the office to avoid delays. Our number is [phone]) [phone]. MyChart is not constantly monitored and due to the large volume of messages a day, replies may take up to 72 business hours.   MyChart Policy: MyChart allows for you to see your visit notes, after visit summary, provider recommendations, lab and tests results, make an appointment, request refills, and contact your provider or the office for non-urgent questions or concerns. Providers are seeing patients during normal business hours and do not have built in time to review MyChart messages.  We ask that you allow a minimum of 3 business days for responses to [employer]. For this reason, please do not send urgent requests through MyChart. Please call the office at [phone]. New and ongoing conditions may require a visit. We have virtual and in person visit available for your convenience.  Complex MyChart concerns may require a visit. Your provider may request you schedule a  virtual or in person visit to ensure we are providing the best care possible. MyChart messages sent after 11:00 AM on Friday will not be received by the provider until Monday morning.    Lab and Test Results: You will receive your lab and test results on MyChart as soon as they are completed and results have been sent by the lab or testing facility. Due to this service, you will receive your results BEFORE your provider.  I review lab and tests results each morning prior to seeing patients. Some results require collaboration with other providers to ensure you are receiving the most appropriate care. For this reason, we ask that you please allow a minimum of 3-5 business days from the time the ALL results have been received for your provider to receive and review lab and test results and contact you about these.  Most lab and test result comments from the provider will be sent through MyChart. Your provider may recommend changes to the plan of care, follow-up visits, repeat testing, ask questions, or request an office visit to discuss these results. You may reply directly to this message or call the office at [phone] to provide information for the provider or set up an appointment. In some instances, you will be called with test results and recommendations. Please let us  know if this is preferred and we will make  note of this in your chart to provide this for you.    If you have not heard a response to your lab or test results in 5 business days from all results returning to MyChart, please call the office to let us  know. We ask that you please avoid calling prior to this time unless there is an emergent concern. Due to high call volumes, this can delay the resulting process.   After Hours: For all non-emergency after hours needs, please call the office at [phone] and select the option to reach the on-call provider service. On-call services are shared between multiple [facility] offices and  therefore it will not be possible to speak directly with your provider. On-call providers may provide medical advice and recommendations, but are unable to provide refills for maintenance medications.  For all emergency or urgent medical needs after normal business hours, we recommend that you seek care at the closest Urgent Care or Emergency Department to ensure appropriate treatment in a timely manner.  [facility] [place] has a 24 hour emergency room located on the ground floor for your convenience.    Urgent Concerns During the Business Day Providers are seeing patients from 8AM to 5PM, Monday through Thursday, and 8AM to 12PM on Friday with a busy schedule and are most often not able to respond to non-urgent calls until the end of the day or the next business day. If you should have URGENT concerns during the day, please call and speak to the nurse or schedule a same day appointment so that we can address your concern without delay.    Thank you, again, for choosing me as your health care partner. I appreciate your trust and look forward to learning more about you.    Evalene Arts, FNP-C

## 2024-01-15 NOTE — Progress Notes (Signed)
 New Patient Office Visit  Subjective   Patient ID: Nathaniel Clark, male    DOB: Nov 30, 1990  Age: 33 y.o. MRN: [id number]  CC:  Chief Complaint  Patient presents with   Establish Care    Blood sugar and reflux   Discussed the use of AI scribe software for clinical note transcription with the patient, who gave verbal consent to proceed.  History of Present Illness   Nathaniel Clark is a 33 year old male who presents to establish with [facility]. He has a recent history of elevated blood sugar levels and increased thirst and increased urination.  He recently visited urgent care due to elevated blood sugar levels, initially recorded at 456 mg/dL at work. At urgent care, his blood sugar was noted to be in the 390s. He has been monitoring his blood sugar at home, with fasting levels ranging from 250 to 280 mg/dL in the mornings and postprandial levels reaching up to 403 mg/dL. He experiences symptoms of excessive thirst, frequent urination, and fatigue.  He mentions a family history of hypertension but denies any personal history of high blood pressure until recently. At urgent care, his blood pressure was recorded at 151/107 mmHg. He has not been on any medication for hypertension.  He reports concerns about low testosterone, feeling fatigued, and lacking sexual desire. He inquires about the potential impact of diabetes on testosterone levels.  He has a history of migraines, which have been present for a few years. Recently, he has experienced shooting pain from his neck to his head, dizziness, lightheadedness, blurred vision, and confusion. These symptoms are sometimes triggered by physical strain or coughing. He has previously seen a neurologist and an ENT specialist, and has tried medications like topiramate and rizatriptan, with limited success. He also mentions a deviated septum but has not pursued surgical correction.  He suspects he may have sleep apnea but has not  undergone a sleep study. He reports acid reflux but denies nausea or vomiting. He has a history of fatty liver noted four years ago but has not followed up on this condition.     Outpatient Encounter Medications as of 01/15/2024  Medication Sig   insulin glargine  (LANTUS  SOLOSTAR) 100 UNIT/ML Solostar Pen Inject 10 Units into the skin at bedtime.   fluticasone  (FLONASE ) 50 MCG/ACT nasal spray Place 2 sprays into both nostrils daily.   guaiFENesin -codeine  (ROBITUSSIN AC) 100-10 MG/5ML syrup Take 5 mLs by mouth 3 (three) times daily as needed for cough.   No facility-administered encounter medications on file as of 01/15/2024.    Patient Active Problem List   Diagnosis Date Noted   Sleep apnea    Viral gastroenteritis 07/24/2019   Past Medical History:  Diagnosis Date   Elevated blood sugar    Fatty liver    Migraines    Past Surgical History:  Procedure Laterality Date   APPENDECTOMY     Family History  Problem Relation Age of Onset   Diabetes Mother    Diabetes Father    Social History   Socioeconomic History   Marital status: Single    Spouse name: Not on file   Number of children: Not on file   Years of education: Not on file   Highest education level: Not on file  Occupational History   Not on file  Tobacco Use   Smoking status: Former    Types: Cigars    Passive exposure: Never   Smokeless tobacco: Never  Vaping Use  Vaping status: Never Used  Substance and Sexual Activity   Alcohol use: Not Currently    Comment: hardly any    Drug use: No   Sexual activity: Not Currently  Other Topics Concern   Not on file  Social History Narrative   Not on file   Social Drivers of Health   Financial Resource Strain: Not on file  Food Insecurity: Not on file  Transportation Needs: Not on file  Physical Activity: Not on file  Stress: Not on file  Social Connections: Not on file  Intimate Partner Violence: Not on file   Outpatient Medications Prior to Visit   Medication Sig Dispense Refill   fluticasone  (FLONASE ) 50 MCG/ACT nasal spray Place 2 sprays into both nostrils daily.     guaiFENesin -codeine  (ROBITUSSIN AC) 100-10 MG/5ML syrup Take 5 mLs by mouth 3 (three) times daily as needed for cough. 120 mL 0   No facility-administered medications prior to visit.   Allergies  Allergen Reactions   Pork-Derived Products Other (See Comments)    Religious reasons   ROS: see HPI    Objective   Today's Vitals   01/15/24 0958  BP: 119/82  Pulse: 82  Resp: 16  SpO2: 95%  Weight: 269 lb 12.8 oz (122.4 kg)  Height: 6' (1.829 m)  PainSc: 0-No pain    GENERAL: Well-appearing, in NAD. Well nourished.  SKIN: Pink, warm and dry. No rash, lesion, ulceration, or ecchymoses.  Head: Normocephalic. NECK: Trachea midline. Full ROM w/o pain or tenderness. No lymphadenopathy.  EARS: Tympanic membranes are intact, translucent without bulging and without drainage. Appropriate landmarks visualized.  EYES: Conjunctiva clear without exudates. EOMI, PERRL, no drainage present.  NOSE: Septum midline w/o deformity. Nares patent, mucosa pink and non-inflamed w/o drainage. No sinus tenderness.  THROAT: Uvula midline. Oropharynx clear. Tonsils non-inflamed without exudate. Mucous membranes pink and moist.  RESPIRATORY: Chest wall symmetrical. Respirations even and non-labored. Breath sounds clear to auscultation bilaterally.  CARDIAC: S1, S2 present, regular rate and rhythm without murmur or gallops. Peripheral pulses 2+ bilaterally.  MSK: Muscle tone and strength appropriate for age. Joints w/o tenderness, redness, or swelling.  EXTREMITIES: Without clubbing, cyanosis, or edema.  NEUROLOGIC: No motor or sensory deficits. Steady, even gait. C2-C12 intact.  PSYCH/MENTAL STATUS: Alert, oriented x 3. Cooperative, appropriate mood and affect.     Assessment & Plan:   1. Encounter to establish care (Primary) Patient is a 33- year-old male who presents today to  establish care with primary care at [facility]. Reviewed the past medical history, family history, social history, surgical history, medications and allergies today- updates made as indicated. Patient has concerns today about recently elevated blood sugars with excessive thirst and urination.   2. Elevated serum glucose with glucosuria See #5 - POCT HgB A1C - Comprehensive metabolic panel with GFR - Lipid panel - TSH Rfx on Abnormal to Free T4  3. Elevated blood pressure reading Blood pressure reading normal in office today. Will continue to monitor.  - Testosterone,Free and Total - CBC with Differential/Platelet  4. Cervicogenic headache Chronic migraines with recent exacerbation. Previous treatments ineffective or poorly tolerated. Has been seen by neurology and ENT in the past- will address at next visit.   5. Uncontrolled type 2 diabetes mellitus with hyperglycemia (HCC) Newly diagnosed with significantly elevated blood glucose levels. POCT A1c completed in office today of 10.2%. Discussed importance of lowering blood sugar in a safe and controlled manner. Initiate nightly insulin therapy based on A1c results. Discussed long-acting  insulin at bedtime, starting Lantus  at 10 units. Advised patient to monitor morning (fasting) blood glucose levels. - Amb Referral to Nutrition and Diabetic Education - insulin glargine  (LANTUS  SOLOSTAR) 100 UNIT/ML Solostar Pen; Inject 10 Units into the skin at bedtime.  Dispense: 15 mL; Refill: PRN  6. Snoring Suspected based on symptoms of fatigue and low energy. No previous sleep study conducted. Epworth sleep study in office completed with score of 15. Referral placed to pulmonology for sleep study to evaluate for possible OSA.  - Ambulatory referral to Pulmonology   Return in about 2 weeks (around 01/29/2024) for Diabetes f/u.   Evalene Arts, FNP

## 2024-01-19 LAB — TSH RFX ON ABNORMAL TO FREE T4: TSH: 1.45 u[IU]/mL (ref 0.450–4.500)

## 2024-01-19 LAB — COMPREHENSIVE METABOLIC PANEL WITH GFR
ALT: 260 IU/L — ABNORMAL HIGH (ref 0–44)
AST: 156 IU/L — ABNORMAL HIGH (ref 0–40)
Albumin: 4.4 g/dL (ref 4.1–5.1)
Alkaline Phosphatase: 149 IU/L — ABNORMAL HIGH (ref 47–123)
BUN/Creatinine Ratio: 11 (ref 9–20)
BUN: 11 mg/dL (ref 6–20)
Bilirubin Total: 0.7 mg/dL (ref 0.0–1.2)
CO2: 22 mmol/L (ref 20–29)
Calcium: 9 mg/dL (ref 8.7–10.2)
Chloride: 98 mmol/L (ref 96–106)
Creatinine, Ser: 1 mg/dL (ref 0.76–1.27)
Globulin, Total: 3.2 g/dL (ref 1.5–4.5)
Glucose: 268 mg/dL — ABNORMAL HIGH (ref 70–99)
Potassium: 4.4 mmol/L (ref 3.5–5.2)
Sodium: 137 mmol/L (ref 134–144)
Total Protein: 7.6 g/dL (ref 6.0–8.5)
eGFR: 103 mL/min/1.73 (ref >59)

## 2024-01-19 LAB — CBC WITH DIFFERENTIAL/PLATELET
Basophils Absolute: 0 x10E3/uL (ref 0.0–0.2)
Basos: 1 %
EOS (ABSOLUTE): 0.2 x10E3/uL (ref 0.0–0.4)
Eos: 5 %
Hematocrit: 48.6 % (ref 37.5–51.0)
Hemoglobin: 15.6 g/dL (ref 13.0–17.7)
Immature Grans (Abs): 0 x10E3/uL (ref 0.0–0.1)
Immature Granulocytes: 0 %
Lymphocytes Absolute: 2.4 x10E3/uL (ref 0.7–3.1)
Lymphs: 57 %
MCH: 28.8 pg (ref 26.6–33.0)
MCHC: 32.1 g/dL (ref 31.5–35.7)
MCV: 90 fL (ref 79–97)
Monocytes Absolute: 0.3 x10E3/uL (ref 0.1–0.9)
Monocytes: 7 %
Neutrophils Absolute: 1.3 x10E3/uL — ABNORMAL LOW (ref 1.4–7.0)
Neutrophils: 30 %
Platelets: 202 x10E3/uL (ref 150–450)
RBC: 5.41 x10E6/uL (ref 4.14–5.80)
RDW: 12.5 % (ref 11.6–15.4)
WBC: 4.2 x10E3/uL (ref 3.4–10.8)

## 2024-01-19 LAB — LIPID PANEL
Chol/HDL Ratio: 5 ratio (ref 0.0–5.0)
Cholesterol, Total: 218 mg/dL — ABNORMAL HIGH (ref 100–199)
HDL: 44 mg/dL (ref >39)
LDL Chol Calc (NIH): 153 mg/dL — ABNORMAL HIGH (ref 0–99)
Triglycerides: 116 mg/dL (ref 0–149)
VLDL Cholesterol Cal: 21 mg/dL (ref 5–40)

## 2024-01-19 LAB — TESTOSTERONE,FREE AND TOTAL
Testosterone, Free: 6.5 pg/mL — ABNORMAL LOW (ref 8.7–25.1)
Testosterone: 287 ng/dL (ref 264–916)

## 2024-01-22 ENCOUNTER — Encounter: Payer: Self-pay | Admitting: Sleep Medicine

## 2024-01-22 ENCOUNTER — Ambulatory Visit (INDEPENDENT_AMBULATORY_CARE_PROVIDER_SITE_OTHER): Admitting: Sleep Medicine

## 2024-01-22 VITALS — BP 126/84 | HR 93 | Temp 97.8°F | Ht 72.0 in | Wt 265.2 lb

## 2024-01-22 DIAGNOSIS — Z794 Long term (current) use of insulin: Secondary | ICD-10-CM

## 2024-01-22 DIAGNOSIS — E669 Obesity, unspecified: Secondary | ICD-10-CM

## 2024-01-22 DIAGNOSIS — G4733 Obstructive sleep apnea (adult) (pediatric): Secondary | ICD-10-CM

## 2024-01-22 DIAGNOSIS — Z6835 Body mass index (BMI) 35.0-35.9, adult: Secondary | ICD-10-CM

## 2024-01-22 DIAGNOSIS — E1169 Type 2 diabetes mellitus with other specified complication: Secondary | ICD-10-CM | POA: Diagnosis not present

## 2024-01-22 NOTE — Patient Instructions (Signed)
 SABRA

## 2024-01-22 NOTE — Progress Notes (Signed)
 Name:Nathaniel Clark MRN: [id number] DOB: [birth year]   CHIEF COMPLAINT:  EXCESSIVE DAYTIME SLEEPINESS   HISTORY OF PRESENT ILLNESS: Mr. Nathaniel Clark is a 33 y.o. w/ a h/o DMII and obesity who present for c/o loud snoring, witnessed apnea and excessive daytime sleepiness which has been present for several years. Reports nocturnal awakenings due to nocturia, however does not have difficulty falling back to sleep. Reports a 30 lb weight gain over the last few years. Admits to dry mouth, night sweats and morning headaches. Denies RLS symptoms, dream enactment, cataplexy, hypnagogic or hypnapompic hallucinations. Reports a family history of sleep apnea. Reports drowsy driving. Drinks 1 soda daily, denies alcohol, tobacco or illicit drug use.   Bedtime 10-11 pm Sleep onset 10 mins Rise time 6 am   EPWORTH SLEEP SCORE 17    01/15/2024   11:00 AM  Results of the Epworth flowsheet  Sitting and reading 3  Watching TV 2  Sitting, inactive in a public place (e.g. a theatre or a meeting) 2  As a passenger in a car for an hour without a break 3  Lying down to rest in the afternoon when circumstances permit 3  Sitting and talking to someone 1  Sitting quietly after a lunch without alcohol 2  In a car, while stopped for a few minutes in traffic 1  Total score 17    PAST MEDICAL HISTORY :   has a past medical history of Elevated blood sugar, Fatty liver, and Migraines.  has a past surgical history that includes Appendectomy. Prior to Admission medications   Medication Sig Start Date End Date Taking? Authorizing Provider  fluticasone  (FLONASE ) 50 MCG/ACT nasal spray Place 2 sprays into both nostrils daily.    [provider]  guaiFENesin -codeine  (ROBITUSSIN AC) 100-10 MG/5ML syrup Take 5 mLs by mouth 3 (three) times daily as needed for cough. 01/06/22   Triplett, Kirk B, FNP  insulin glargine  (LANTUS  SOLOSTAR) 100 UNIT/ML Solostar Pen Inject 10 Units into the skin at bedtime. 01/15/24    Towana Small, FNP   Allergies  Allergen Reactions   Pork-Derived Products Other (See Comments)    Religious reasons    FAMILY HISTORY:  family history includes Diabetes in his father and mother. SOCIAL HISTORY:  reports that he has quit smoking. His smoking use included cigars. He has never been exposed to tobacco smoke. He has never used smokeless tobacco. He reports that he does not currently use alcohol. He reports that he does not use drugs.   Review of Systems:  Gen:  Denies  fever, sweats, chills weight loss  HEENT: Denies blurred vision, double vision, ear pain, eye pain, hearing loss, nose bleeds, sore throat Cardiac:  No dizziness, chest pain or heaviness, chest tightness,edema, No JVD Resp:   No cough, -sputum production, -shortness of breath,-wheezing, -hemoptysis,  Gi: Denies swallowing difficulty, stomach pain, nausea or vomiting, diarrhea, constipation, bowel incontinence Gu:  Denies bladder incontinence, burning urine Ext:   Denies Joint pain, stiffness or swelling Skin: Denies  skin rash, easy bruising or bleeding or hives Endoc:  Denies polyuria, polydipsia , polyphagia or weight change Psych:   Denies depression, insomnia or hallucinations  Other:  All other systems negative  VITAL SIGNS: BP 126/84   Pulse 93   Temp 97.8 F (36.6 C)   Ht 6' (1.829 m)   Wt 265 lb 3.2 oz (120.3 kg)   SpO2 97%   BMI 35.97 kg/m     Physical Examination:  General Appearance: No distress  EYES PERRLA, EOM intact.   NECK Supple, No JVD Pulmonary: normal breath sounds, No wheezing.  CardiovascularNormal S1,S2.  No m/r/g.   Abdomen: Benign, Soft, non-tender. Skin:   warm, no rashes, no ecchymosis  Extremities: normal, no cyanosis, clubbing. Neuro:without focal findings,  speech normal  PSYCHIATRIC: Mood, affect within normal limits.   ASSESSMENT AND PLAN  OSA I suspect that OSA is likely present due to clinical presentation. Discussed the consequences of  untreated sleep apnea. Advised not to drive drowsy for safety of patient and others. Will complete further evaluation with a home sleep study and follow up to review results.    DMII Stable, on current management. Following with PCP.   Obesity Counseled patient on diet and lifestyle modification.    MEDICATION ADJUSTMENTS/LABS AND TESTS ORDERED: Recommend Sleep Study   Patient  satisfied with Plan of action and management. All questions answered  Follow up to review HST results and treatment plan.   I spent a total of  30 minutes reviewing chart data, face-to-face evaluation with the patient, counseling and coordination of care as detailed above.    Jeret Goyer, M.D.  Sleep Medicine [facility]

## 2024-01-30 ENCOUNTER — Ambulatory Visit: Admitting: Family Medicine

## 2024-01-30 VITALS — BP 113/77 | HR 74 | Resp 16 | Ht 67.0 in | Wt 265.0 lb

## 2024-01-30 DIAGNOSIS — E1165 Type 2 diabetes mellitus with hyperglycemia: Secondary | ICD-10-CM | POA: Insufficient documentation

## 2024-01-30 DIAGNOSIS — E782 Mixed hyperlipidemia: Secondary | ICD-10-CM

## 2024-01-30 DIAGNOSIS — G4733 Obstructive sleep apnea (adult) (pediatric): Secondary | ICD-10-CM | POA: Diagnosis not present

## 2024-01-30 DIAGNOSIS — K760 Fatty (change of) liver, not elsewhere classified: Secondary | ICD-10-CM | POA: Diagnosis not present

## 2024-01-30 MED ORDER — DEXCOM G6 RECEIVER DEVI: 1.0000 | Freq: Once | 0 refills | Status: AC

## 2024-01-30 MED ORDER — LANTUS SOLOSTAR 100 UNIT/ML ~~LOC~~ SOPN: 10.0000 [IU] | PEN_INJECTOR | Freq: Every day | SUBCUTANEOUS | 99 refills | Status: DC

## 2024-01-30 MED ORDER — LANTUS SOLOSTAR 100 UNIT/ML ~~LOC~~ SOPN: 12.0000 [IU] | PEN_INJECTOR | Freq: Every day | SUBCUTANEOUS | 99 refills | Status: AC

## 2024-01-30 MED ORDER — DEXCOM G6 SENSOR MISC: 99 refills | Status: DC

## 2024-01-30 MED ORDER — TIRZEPATIDE 2.5 MG/0.5ML ~~LOC~~ SOAJ: 2.5000 mg | SUBCUTANEOUS | 1 refills | Status: DC

## 2024-01-30 MED ORDER — DEXCOM G6 TRANSMITTER MISC: 1.0000 | Freq: Once | 99 refills | Status: AC

## 2024-01-30 NOTE — Progress Notes (Signed)
 Established Patient Office Visit  Subjective  Patient ID: Nathaniel Clark, male    DOB: [birth year]  Age: 33 y.o. MRN: [id number]  Chief Complaint  Patient presents with   Diabetes   Discussed the use of AI scribe software for clinical note transcription with the patient, who gave verbal consent to proceed.  History of Present Illness    Nathaniel Clark is a 33 year old male with diabetes who presents for a follow-up visit. His most recent HbA1c on 9/16 was 10.2%.   He has been managing his diabetes with Lantus  insulin but experienced nausea and vomiting after starting the medication, leading to a temporary pause for a few days. He increased his Lantus  dose to 12 units at bedtime, resulting in morning blood sugars consistently under 200 mg/dL. He did have a blood sugar of 83 mg/dL before dinner, due to only eating a salad for lunch. Diabetes education did contact him but he missed the call- he plans to call them back to schedule an appointment. His fasting blood sugars ranging from 9/19-9/30 are 121, 216, 203, 180, 170, 156, 166, 145, 119, 128, 179, 135 mg/dL. Only one reading was in the high 200s.   He has made dietary changes, attempting to avoid high-calorie foods like cheeseburgers. Despite these changes, he experiences headaches, which were severe during the first five days of starting Lantus  but have since decreased. No shakiness or feeling lightheaded with low blood sugar levels, but he notes persistent headaches and occasional lightheadedness and dizziness during the initial week of insulin.  His fingers are sore from frequent blood sugar testing and he is interested in a continuous glucose monitoring system.   He mentions a recent appt with pulmonology and was diagnosed with OSA. He has recently ordered equipment for management.  Reviewed recent blood work- with elevated liver function tests. His cholesterol levels are slightly elevated, with a total cholesterol of 218 mg/dL and LDL of 846  mg/dL.  ROS: see HPI    Objective:    BP 113/77   Pulse 74   Resp 16   Ht 5' 7 (1.702 m)   Wt 265 lb (120.2 kg)   SpO2 99%   BMI 41.50 kg/m  BP Readings from Last 3 Encounters:  01/30/24 113/77  01/22/24 126/84  01/15/24 119/82     Physical Exam Vitals reviewed.  Constitutional:      Appearance: Normal appearance. He is obese.  Cardiovascular:     Rate and Rhythm: Normal rate and regular rhythm.     Pulses: Normal pulses.     Heart sounds: Normal heart sounds.  Pulmonary:     Effort: Pulmonary effort is normal.     Breath sounds: Normal breath sounds.  Neurological:     Mental Status: He is alert.  Psychiatric:        Mood and Affect: Mood normal.        Behavior: Behavior normal.     Assessment & Plan:   1. Uncontrolled type 2 diabetes mellitus with hyperglycemia (HCC) (Primary) Type 2 diabetes mellitus on insulin therapy. Blood glucose improved with Lantus , now under 200 mg/dL. Continue Lantus  12 units at bedtime. Discussed continuation of monitoring blood glucose- rx sent for CGM. Contact nutritionist for dietary guidance. Discussed GLP-1 agonists for glucose control, fatty liver, and sleep apnea. Rx sent for Mounjaro and advised patient to continue to monitor blood glucose daily.  - tirzepatide (MOUNJARO) 2.5 MG/0.5ML Pen; Inject 2.5 mg into the skin once a week.  Dispense:  2 mL; Refill: 1 - Continuous Glucose Receiver (DEXCOM G6 RECEIVER) DEVI; 1 each by Does not apply route once for 1 dose.  Dispense: 1 each; Refill: 0 - Continuous Glucose Sensor (DEXCOM G6 SENSOR) MISC; Please change your sensor every 10 days.  Dispense: 3 each; Refill: PRN - Continuous Glucose Transmitter (DEXCOM G6 TRANSMITTER) MISC; 1 each by Does not apply route once for 1 dose.  Dispense: 1 each; Refill: PRN - insulin glargine  (LANTUS  SOLOSTAR) 100 UNIT/ML Solostar Pen; Inject 12 Units into the skin at bedtime.  Dispense: 15 mL; Refill: PRN  2. Obstructive sleep apnea syndrome Followed by  pulmonology. Sleep apnea equipment ordered. Weight loss and diet may alleviate symptoms. Encourage weight loss and dietary improvements. - tirzepatide (MOUNJARO) 2.5 MG/0.5ML Pen; Inject 2.5 mg into the skin once a week.  Dispense: 2 mL; Refill: 1  3. NAFLD (nonalcoholic fatty liver disease) Elevated liver function tests suggest fatty liver, likely due to high blood sugars. Repeat liver function tests next visit. Consider GLP-1 agonist therapy.  4. Mixed hyperlipidemia Elevated cholesterol with total 218 mg/dL, LDL 846 mg/dL. Discussed high blood sugars may increase cardiovascular risk. Monitor cholesterol levels closely. Encourage dietary changes to improve lipid profile.     Return in about 11 weeks (around 04/16/2024) for Diabetes f/u.    Evalene Arts, FNP

## 2024-01-30 NOTE — Patient Instructions (Signed)

## 2024-02-01 ENCOUNTER — Ambulatory Visit: Admitting: Pediatrics

## 2024-02-09 ENCOUNTER — Encounter

## 2024-02-09 DIAGNOSIS — G4733 Obstructive sleep apnea (adult) (pediatric): Secondary | ICD-10-CM

## 2024-02-19 DIAGNOSIS — G4730 Sleep apnea, unspecified: Secondary | ICD-10-CM | POA: Diagnosis not present

## 2024-02-20 ENCOUNTER — Ambulatory Visit: Payer: Self-pay

## 2024-02-20 DIAGNOSIS — G4733 Obstructive sleep apnea (adult) (pediatric): Secondary | ICD-10-CM

## 2024-03-01 ENCOUNTER — Emergency Department
Admission: EM | Admit: 2024-03-01 | Discharge: 2024-03-01 | Disposition: A | Discharge status: Home / Self Care | Attending: Emergency Medicine | Admitting: Emergency Medicine

## 2024-03-01 ENCOUNTER — Emergency Department

## 2024-03-01 ENCOUNTER — Other Ambulatory Visit: Payer: Self-pay

## 2024-03-01 DIAGNOSIS — E119 Type 2 diabetes mellitus without complications: Secondary | ICD-10-CM | POA: Insufficient documentation

## 2024-03-01 DIAGNOSIS — J40 Bronchitis, not specified as acute or chronic: Secondary | ICD-10-CM | POA: Diagnosis not present

## 2024-03-01 DIAGNOSIS — R519 Headache, unspecified: Secondary | ICD-10-CM | POA: Diagnosis not present

## 2024-03-01 DIAGNOSIS — R059 Cough, unspecified: Secondary | ICD-10-CM | POA: Diagnosis present

## 2024-03-01 HISTORY — DX: Type 2 diabetes mellitus without complications: E11.9

## 2024-03-01 LAB — BASIC METABOLIC PANEL WITH GFR
Anion gap: 10 (ref 5–15)
BUN: 11 mg/dL (ref 6–20)
CO2: 26 mmol/L (ref 22–32)
Calcium: 9 mg/dL (ref 8.9–10.3)
Chloride: 104 mmol/L (ref 98–111)
Creatinine, Ser: 0.91 mg/dL (ref 0.61–1.24)
GFR, Estimated: >60 mL/min (ref >60)
Glucose, Bld: 91 mg/dL (ref 70–99)
Potassium: 3.8 mmol/L (ref 3.5–5.1)
Sodium: 140 mmol/L (ref 135–145)

## 2024-03-01 LAB — CBC WITH DIFFERENTIAL/PLATELET
Abs Immature Granulocytes: 0 K/uL (ref 0.00–0.07)
Basophils Absolute: 0 K/uL (ref 0.0–0.1)
Basophils Relative: 1 %
Eosinophils Absolute: 0.3 K/uL (ref 0.0–0.5)
Eosinophils Relative: 5 %
HCT: 44.7 % (ref 39.0–52.0)
Hemoglobin: 15.5 g/dL (ref 13.0–17.0)
Immature Granulocytes: 0 %
Lymphocytes Relative: 48 %
Lymphs Abs: 2.7 K/uL (ref 0.7–4.0)
MCH: 29.8 pg (ref 26.0–34.0)
MCHC: 34.7 g/dL (ref 30.0–36.0)
MCV: 85.8 fL (ref 80.0–100.0)
Monocytes Absolute: 0.3 K/uL (ref 0.1–1.0)
Monocytes Relative: 6 %
Neutro Abs: 2.2 K/uL (ref 1.7–7.7)
Neutrophils Relative %: 40 %
Platelets: 221 K/uL (ref 150–400)
RBC: 5.21 MIL/uL (ref 4.22–5.81)
RDW: 12.5 % (ref 11.5–15.5)
WBC: 5.5 K/uL (ref 4.0–10.5)
nRBC: 0 % (ref 0.0–0.2)

## 2024-03-01 LAB — RESP PANEL BY RT-PCR (RSV, FLU A&B, COVID)  RVPGX2
Influenza A by PCR: NEGATIVE
Influenza B by PCR: NEGATIVE
Resp Syncytial Virus by PCR: NEGATIVE
SARS Coronavirus 2 by RT PCR: NEGATIVE

## 2024-03-01 MED ORDER — KETOROLAC TROMETHAMINE 30 MG/ML IJ SOLN
30.0000 mg | Freq: Once | INTRAMUSCULAR | Status: AC
  Administered 2024-03-01: 30 mg via INTRAMUSCULAR
  Filled 2024-03-01: qty 1

## 2024-03-01 NOTE — ED Triage Notes (Signed)
 Pt to ED for pulsating feeling in his head (from posterior to anterior) that happens only when he coughs for last 2-3 days. Has had a cough for last 3-4 days with 'greenish' mucous. Hx migraines.  Respirations are unlabored. Skin is dry.

## 2024-03-01 NOTE — ED Provider Notes (Signed)
 [facility] Provider Note    Event Date/Time   First MD Initiated Contact with Patient 03/01/24 1119     (approximate)   History   Chief Complaint Cough and Headache   HPI  Nathaniel Clark is a 33 y.o. male with past medical history of migraines and diabetes who presents to the ED complaining of headache.  Patient reports that he has had a couple days of cough productive of greenish sputum that he says causes severe pain shooting into the back of his head.  He states that he feels dizzy and lightheaded when he coughs, believes he passed out last night while coughing and sitting in his recliner.  He denies any associated chest pain or shortness of breath and has not had any fevers, nausea, vomiting, or diarrhea.  He denies any vision changes, speech changes, numbness, or weakness.     Physical Exam   Triage Vital Signs: ED Triage Vitals  Encounter Vitals Group     BP 03/01/24 1046 (!) 130/95     Girls Systolic BP Percentile --      Girls Diastolic BP Percentile --      Boys Systolic BP Percentile --      Boys Diastolic BP Percentile --      Pulse Rate 03/01/24 1046 84     Resp 03/01/24 1046 20     Temp 03/01/24 1046 98.3 F (36.8 C)     Temp Source 03/01/24 1046 Oral     SpO2 03/01/24 1046 100 %     Weight 03/01/24 1045 250 lb (113.4 kg)     Height 03/01/24 1045 6' (1.829 m)     Head Circumference --      Peak Flow --      Pain Score 03/01/24 1043 3     Pain Loc --      Pain Education --      Exclude from Growth Chart --     Most recent vital signs: Vitals:   03/01/24 1046  BP: (!) 130/95  Pulse: 84  Resp: 20  Temp: 98.3 F (36.8 C)  SpO2: 100%    Constitutional: Alert and oriented. Eyes: Conjunctivae are normal. Head: Atraumatic. Nose: No congestion/rhinnorhea. Mouth/Throat: Mucous membranes are moist.  Neck: Supple with no meningismus. Cardiovascular: Normal rate, regular rhythm. Grossly normal heart sounds.  2+ radial pulses  bilaterally. Respiratory: Normal respiratory effort.  No retractions. Lungs CTAB. Gastrointestinal: Soft and nontender. No distention. Musculoskeletal: No lower extremity tenderness nor edema.  Neurologic:  Normal speech and language. No gross focal neurologic deficits are appreciated.    ED Results / Procedures / Treatments   Labs (all labs ordered are listed, but only abnormal results are displayed) Labs Reviewed  RESP PANEL BY RT-PCR (RSV, FLU A&B, COVID)  [id number]  CBC WITH DIFFERENTIAL/PLATELET  BASIC METABOLIC PANEL WITH GFR     EKG  ED ECG REPORT I, Carlin Palin, the attending physician, personally viewed and interpreted this ECG.   Date: 03/01/2024  EKG Time: 11:55  Rate: 76  Rhythm: normal sinus rhythm  Axis: Normal  Intervals:none  ST&T Change: None  RADIOLOGY Chest x-ray reviewed and interpreted by me with no infiltrate, edema, or effusion.  PROCEDURES:  Critical Care performed: No  Procedures   MEDICATIONS ORDERED IN ED: Medications  ketorolac  (TORADOL ) 30 MG/ML injection 30 mg (30 mg Intramuscular Given 03/01/24 1140)     IMPRESSION / MDM / ASSESSMENT AND PLAN / ED COURSE  I reviewed the triage  vital signs and the nursing notes.                              32 y.o. male with past medical history of migraines and diabetes who presents to the ED complaining of severe headache when coughing over the past 2 days along with possible syncopal episode last night.  Patient's presentation is most consistent with acute presentation with potential threat to life or bodily function.  Differential diagnosis includes, but is not limited to, arrhythmia, vasovagal episode, orthostatic hypotension, situational syncope, pneumonia, bronchitis, COVID-19, influenza, anemia, electrolyte abnormality, AKI.  Patient well-appearing and in no acute distress, vital signs are unremarkable.  He has a nonfocal neurologic exam with no findings concerning for meningitis,  describes headache as primarily when coughing and overall low suspicion for [facility].  We will check CT head, screen EKG and labs.  Plan to treat symptomatically with IM Toradol  and reassess following results.  CT head is negative for acute process, labs without significant anemia, leukocytosis, electrolyte abnormality, or AKI.  COVID and flu testing is negative, patient feeling better following dose of Toradol .  Suspect viral syndrome and patient appropriate for discharge home with outpatient follow-up.  He was counseled to return to the ED for new or worsening symptoms, patient agrees with plan.      FINAL CLINICAL IMPRESSION(S) / ED DIAGNOSES   Final diagnoses:  Acute nonintractable headache, unspecified headache type  Bronchitis     Rx / DC Orders   ED Discharge Orders     None        Note:  This document was prepared using Dragon voice recognition software and may include unintentional dictation errors.   Willo Dunnings, MD 03/01/24 1242

## 2024-03-04 ENCOUNTER — Ambulatory Visit (INDEPENDENT_AMBULATORY_CARE_PROVIDER_SITE_OTHER): Admitting: Family Medicine

## 2024-03-04 ENCOUNTER — Encounter: Payer: Self-pay | Admitting: Family Medicine

## 2024-03-04 ENCOUNTER — Other Ambulatory Visit: Payer: Self-pay | Admitting: Family Medicine

## 2024-03-04 VITALS — BP 113/81 | HR 90 | Temp 98.3°F | Resp 18 | Wt 258.0 lb

## 2024-03-04 DIAGNOSIS — G4486 Cervicogenic headache: Secondary | ICD-10-CM | POA: Diagnosis not present

## 2024-03-04 DIAGNOSIS — E1165 Type 2 diabetes mellitus with hyperglycemia: Secondary | ICD-10-CM | POA: Diagnosis not present

## 2024-03-04 DIAGNOSIS — J322 Chronic ethmoidal sinusitis: Secondary | ICD-10-CM | POA: Insufficient documentation

## 2024-03-04 MED ORDER — KETOROLAC TROMETHAMINE 60 MG/2ML IM SOLN
60.0000 mg | Freq: Once | INTRAMUSCULAR | Status: AC
  Administered 2024-03-04: 60 mg via INTRAMUSCULAR

## 2024-03-04 MED ORDER — KETOROLAC TROMETHAMINE 10 MG PO TABS: ORAL_TABLET | ORAL | 0 refills | Status: AC

## 2024-03-04 MED ORDER — METHOCARBAMOL 500 MG PO TABS: 500.0000 mg | ORAL_TABLET | Freq: Three times a day (TID) | ORAL | 0 refills | Status: AC | PRN

## 2024-03-04 MED ORDER — DEXCOM G7 15 DAY SENSOR MISC: 1.0000 | 3 refills | Status: AC

## 2024-03-04 NOTE — Progress Notes (Signed)
 Acute Care Office Visit  Subjective:   Nathaniel Clark 06/17/1990 03/04/2024  HPI:  Discussed the use of AI scribe software for clinical note transcription with the patient, who gave verbal consent to proceed.  History of Present Illness    Nathaniel Clark is a 33 year old male who presents with a severe cough and associated head pain.  He has been experiencing a severe cough since Saturday, which prompted a visit to the emergency room. The cough is associated with shooting pain radiating through the back of his neck and causing numbness in his toes, feet, and hands. A CT scan performed at the emergency room was normal. He also experiences lightheadedness and blurry vision, which are triggered by the cough. Although he has a history of migraines, he states that this pain feels different and more severe.  He reports shortness of breath and lightheadedness. No regular nausea, vomiting, or sensitivity to light and sound. He has been coughing up green mucus and has tried ibuprofen for the head pain without relief. Toradol  was administered at the emergency room but did not significantly alleviate the headache. The head pain is less severe when he is not coughing.  He has a history of chronic sinusitis. An ear, nose, and throat specialist mentioned a deviated septum but did not recommend surgery at the time. He experiences headaches primarily when coughing or straining.  He manages his diabetes with ten units of insulin. His blood sugar levels are generally stable, with morning readings around 80-90 mg/dL and evening readings around 120-130 mg/dL. He recently underwent a sleep study and is awaiting follow-up regarding CPAP or APAP therapy.  He has previously used medications such as rizatriptan and topiramate for migraines, but he reports adverse effects with topiramate, including mental issues. He has also tried muscle relaxers in the past, which made him sleepy but did not provide significant  relief.      The following portions of the patient's history were reviewed and updated as appropriate: past medical history, past surgical history, family history, social history, allergies, medications, and problem list.   Patient Active Problem List   Diagnosis Date Noted   Uncontrolled type 2 diabetes mellitus with hyperglycemia (HCC) 01/30/2024   Sleep apnea    Past Medical History:  Diagnosis Date   Diabetes mellitus without complication (HCC)    type 2   Elevated blood sugar    Fatty liver    Migraines    Past Surgical History:  Procedure Laterality Date   APPENDECTOMY     Family History  Problem Relation Age of Onset   Diabetes Mother    Diabetes Father    Outpatient Medications Prior to Visit  Medication Sig Dispense Refill   insulin glargine  (LANTUS  SOLOSTAR) 100 UNIT/ML Solostar Pen Inject 12 Units into the skin at bedtime. 15 mL PRN   tirzepatide (MOUNJARO) 2.5 MG/0.5ML Pen Inject 2.5 mg into the skin once a week. 2 mL 1   Continuous Glucose Sensor (DEXCOM G6 SENSOR) MISC Please change your sensor every 10 days. 3 each PRN   No facility-administered medications prior to visit.   Allergies  Allergen Reactions   Porcine (Pork) Protein-Containing Drug Products Other (See Comments)    Religious reasons     ROS: A complete ROS was performed with pertinent positives/negatives noted in the HPI. The remainder of the ROS are negative.    Objective:   Today's Vitals   03/04/24 1510  BP: 113/81  Pulse: 90  Resp: 18  Temp: 98.3 F (36.8 C)  TempSrc: Oral  SpO2: 95%  Weight: 258 lb (117 kg)    GENERAL: Well-appearing, in NAD. Well nourished.  SKIN: Pink, warm and dry. No rash, lesion, ulceration, or ecchymoses.  Head: Normocephalic. NECK: Trachea midline. Full ROM w/o pain or tenderness. No lymphadenopathy.  EARS: Tympanic membranes are intact, translucent without bulging and without drainage. Appropriate landmarks visualized.  EYES: Conjunctiva clear  without exudates. EOMI, PERRL, no drainage present.  NOSE: Septum midline w/o deformity. Nares patent, mucosa pink and non-inflamed w/o drainage. No sinus tenderness.  THROAT: Uvula midline. Oropharynx clear. Tonsils non-inflamed without exudate. Mucous membranes pink and moist.  RESPIRATORY: Chest wall symmetrical. Respirations even and non-labored. Breath sounds clear to auscultation bilaterally.  CARDIAC: S1, S2 present, regular rate and rhythm without murmur or gallops. Peripheral pulses 2+ bilaterally.  MSK: Muscle tone and strength appropriate for age. Joints w/o tenderness, redness, or swelling.  EXTREMITIES: Without clubbing, cyanosis, or edema.  NEUROLOGIC: No motor or sensory deficits. Steady, even gait. C2-C12 intact.  PSYCH/MENTAL STATUS: Alert, oriented x 3. Cooperative, appropriate mood and affect.     Assessment & Plan:   1. Cervicogenic headache (Primary) Severe headache with neck pain exacerbated by coughing. Recent UC visit on 11/1 with normal CT scan. Differential includes migraine, pinched nerve, or sinus-related pain. Previous migraine medications not well tolerated. Considered injectable sumatriptan for acute relief. Referred to neurology for further evaluation. Prescribed oral Toradol  20 mg once daily, with an additional 10 mg if headache persists x5 days. Prescribed muscle relaxer for neck pain. - Ambulatory referral to Neurology - ketorolac  (TORADOL ) 10 MG tablet; 20 mg once followed by 10 mg orally every 4 to 6 hours as needed, MAX 40 mg/day  Dispense: 20 tablet; Refill: 0 - methocarbamol (ROBAXIN) 500 MG tablet; Take 1 tablet (500 mg total) by mouth 3 (three) times daily as needed for muscle spasms.  Dispense: 30 tablet; Refill: 0  2. Uncontrolled type 2 diabetes mellitus with hyperglycemia (HCC) Blood glucose levels generally well-controlled with insulin. Recent readings around 80-90 mg/dL upon waking and 879-869 mg/dL before sleep. Dexcom G7 used for continuous glucose  monitoring. Monitor blood glucose levels with Dexcom G7. Due to repeat A1c around 04/15/2024.  - Continuous Glucose Sensor (DEXCOM G7 15 DAY SENSOR) MISC; 1 each by Does not apply route every 14 (fourteen) days.  Dispense: 1 each; Refill: 3  3. Chronic ethmoidal sinusitis Chronic sinusitis. Thickening of sinuses on CT scan. Symptoms may contribute to headache and neck pain. Previous ENT evaluation suggested surgery or deviated septum, but not pursued. Referred to neurology for further evaluation. Will consider ENT referral if neurology evaluation is inconclusive.   Meds ordered this encounter  Medications   Continuous Glucose Sensor (DEXCOM G7 15 DAY SENSOR) MISC    Sig: 1 each by Does not apply route every 14 (fourteen) days.    Dispense:  1 each    Refill:  3    Supervising Provider:   ZIGLAR, SUSAN K [[id number]]   ketorolac  (TORADOL ) 10 MG tablet    Sig: 20 mg once followed by 10 mg orally every 4 to 6 hours as needed, MAX 40 mg/day    Dispense:  20 tablet    Refill:  0    Supervising Provider:   ZIGLAR, SUSAN K [[id number]]   methocarbamol (ROBAXIN) 500 MG tablet    Sig: Take 1 tablet (500 mg total) by mouth 3 (three) times daily as needed for muscle spasms.    Dispense:  30 tablet    Refill:  0    Supervising Provider:   ZIGLAR, SUSAN K [[id number]]    Return if symptoms worsen or fail to improve.    Patient to reach out to office if new, worrisome, or unresolved symptoms arise or if no improvement in patient's condition. Patient verbalized understanding and is agreeable to treatment plan. All questions answered to patient's satisfaction.    Evalene Arts, FNP

## 2024-03-04 NOTE — Addendum Note (Signed)
 Addended by: Marcanthony Sleight A on: 03/04/2024 03:52 PM   Modules accepted: Orders

## 2024-03-06 ENCOUNTER — Other Ambulatory Visit: Payer: Self-pay | Admitting: Family Medicine

## 2024-03-06 DIAGNOSIS — E1165 Type 2 diabetes mellitus with hyperglycemia: Secondary | ICD-10-CM

## 2024-03-06 MED ORDER — DEXCOM G7 SENSOR MISC: 3 refills | Status: AC

## 2024-03-06 NOTE — Telephone Encounter (Signed)
 Copied from CRM [id number]. Topic: Clinical - Prescription Issue >> Mar 06, 2024 12:06 PM Nathaniel Clark wrote: Reason for CRM: Pls resend script Pending Continuous Glucose Sensor 1 EACH BY DOES NOT APPLY ROUTE EVERY 14 (FOURTEEN) DAYS - needs to be every 10 days.. this was from [facility] called trying to fill

## 2024-03-26 ENCOUNTER — Other Ambulatory Visit: Payer: Self-pay | Admitting: Family Medicine

## 2024-03-26 DIAGNOSIS — G4733 Obstructive sleep apnea (adult) (pediatric): Secondary | ICD-10-CM

## 2024-03-26 DIAGNOSIS — E1165 Type 2 diabetes mellitus with hyperglycemia: Secondary | ICD-10-CM

## 2024-04-02 ENCOUNTER — Ambulatory Visit: Admitting: Neurology

## 2024-04-02 ENCOUNTER — Other Ambulatory Visit: Payer: Self-pay | Admitting: Family Medicine

## 2024-04-02 DIAGNOSIS — G4733 Obstructive sleep apnea (adult) (pediatric): Secondary | ICD-10-CM

## 2024-04-02 DIAGNOSIS — E1165 Type 2 diabetes mellitus with hyperglycemia: Secondary | ICD-10-CM

## 2024-04-02 MED ORDER — TIRZEPATIDE 2.5 MG/0.5ML ~~LOC~~ SOAJ: 2.5000 mg | SUBCUTANEOUS | 1 refills | Status: DC

## 2024-04-02 NOTE — Telephone Encounter (Signed)
 Copied from CRM [id number]. Topic: Clinical - Medication Refill >> Apr 02, 2024  8:06 AM Larissa S wrote: Medication: tirzepatide  (MOUNJARO ) 2.5 MG/0.5ML Pen  Has the patient contacted their pharmacy? Yes (Agent: If no, request that the patient contact the pharmacy for the refill. If patient does not wish to contact the pharmacy document the reason why and proceed with request.) (Agent: If yes, when and what did the pharmacy advise?)  This is the patient's preferred pharmacy:  [id number] [place], [place] - [address] [place] [address] Phone: [phone] Fax: [phone]  Is this the correct pharmacy for this prescription? Yes If no, delete pharmacy and type the correct one.   Has the prescription been filled recently? No  Is the patient out of the medication? Yes  Has the patient been seen for an appointment in the last year OR does the patient have an upcoming appointment? Yes  Can we respond through MyChart? Yes  Agent: Please be advised that Rx refills may take up to 3 business days. We ask that you follow-up with your pharmacy.

## 2024-04-16 ENCOUNTER — Other Ambulatory Visit: Payer: Self-pay | Admitting: Family Medicine

## 2024-04-16 ENCOUNTER — Ambulatory Visit: Admitting: Family Medicine

## 2024-04-16 ENCOUNTER — Encounter: Payer: Self-pay | Admitting: Family Medicine

## 2024-04-16 VITALS — BP 105/77 | HR 88 | Resp 16 | Ht 72.0 in | Wt 258.0 lb

## 2024-04-16 DIAGNOSIS — R7989 Other specified abnormal findings of blood chemistry: Secondary | ICD-10-CM

## 2024-04-16 DIAGNOSIS — E1165 Type 2 diabetes mellitus with hyperglycemia: Secondary | ICD-10-CM

## 2024-04-16 DIAGNOSIS — K760 Fatty (change of) liver, not elsewhere classified: Secondary | ICD-10-CM | POA: Diagnosis not present

## 2024-04-16 DIAGNOSIS — E782 Mixed hyperlipidemia: Secondary | ICD-10-CM

## 2024-04-16 DIAGNOSIS — R5383 Other fatigue: Secondary | ICD-10-CM | POA: Diagnosis not present

## 2024-04-16 LAB — POCT GLYCOSYLATED HEMOGLOBIN (HGB A1C): Hemoglobin A1C: 5.6 % (ref 4.0–5.6)

## 2024-04-16 MED ORDER — TIRZEPATIDE 5 MG/0.5ML ~~LOC~~ SOAJ: 5.0000 mg | SUBCUTANEOUS | 1 refills | Status: AC

## 2024-04-16 MED ORDER — FREESTYLE LIBRE 14 DAY READER DEVI: 2 refills | Status: DC

## 2024-04-16 MED ORDER — FREESTYLE LIBRE 14 DAY SENSOR MISC: 10 refills | Status: AC

## 2024-04-16 NOTE — Progress Notes (Signed)
 Established Patient Office Visit  Subjective  Patient ID: Nathaniel Clark, male    DOB: 06-02-1990  Age: 33 y.o. MRN: [id number]  Chief Complaint  Patient presents with   Diabetes   Discussed the use of AI scribe software for clinical note transcription with the patient, who gave verbal consent to proceed.  History of Present Illness   Nathaniel Clark is a 33 year old male with diabetes who presents for diabetes management.  He manages his diabetes with Mounjaro  2.5 mg weekly and Lantus , adjusting the Lantus  dose between 6 to 8 units every other night, and has not taken it in the past two to three days. His fasting blood sugars are in the high 90s to low 100s, with postprandial spikes reaching up to 180 mg/dL. He uses a continuous glucose monitor, which recently showed a spike to 162 mg/dL during sleep. Blood sugar levels tend to become irregular towards the end of the Mounjaro  cycle, particularly on Monday, Tuesday, and Wednesday, with spikes up to 190 mg/dL after meals.  His A1c today is 5.6%. He attributes improvements in blood sugar control to dietary changes and exercise. He wants to discontinue insulin and manage diabetes with Mounjaro  alone. He is aware of different Mounjaro  dosages and is considering an increase. Denies chest pain, shortness of breath, vision changes, polydipsia, polyphagia, polyuria, open wounds/ulcers on feet. Denies hypoglycemia.   He discusses the cost of his continuous glucose monitor, which is $35 every ten days, and expresses interest in exploring more affordable options like the [facility], which he believes may offer a longer interval between replacements. He finds the continuous glucose monitor helpful in understanding the impact of different foods on his blood sugar levels.  He is fasting today for blood work, including cholesterol, liver function, and testosterone  levels, as previous tests showed low free testosterone  and abnormal liver function.     Lab  Results  Component Value Date   HGBA1C 5.6 04/16/2024    Wt Readings from Last 3 Encounters:  04/16/24 258 lb (117 kg)  03/04/24 258 lb (117 kg)  03/01/24 250 lb (113.4 kg)      04/16/2024    9:52 AM 03/04/2024    3:18 PM 01/30/2024   11:01 AM 01/15/2024   10:08 AM  GAD 7 : Generalized Anxiety Score  Nervous, Anxious, on Edge  1 2 0  Control/stop worrying 0 1 2 3   Worry too much - different things 1 2 2 1   Trouble relaxing 0 2 2 2   Restless 0 1 1 1   Easily annoyed or irritable 1 1 2 1   Afraid - awful might happen 0 2 1 1   Total GAD 7 Score  10 12 9   Anxiety Difficulty Not difficult at all Somewhat difficult        04/16/2024    9:52 AM 03/04/2024    3:17 PM 01/30/2024   11:01 AM  PHQ9 SCORE ONLY  PHQ-9 Total Score 7 11 12    ROS: see HPI     Objective:     BP 105/77   Pulse 88   Resp 16   Ht 6' (1.829 m)   Wt 258 lb (117 kg)   BMI 34.99 kg/m  BP Readings from Last 3 Encounters:  04/16/24 105/77  03/04/24 113/81  03/01/24 112/71     Physical Exam Vitals reviewed.  Constitutional:      Appearance: Normal appearance.  Cardiovascular:     Rate and Rhythm: Normal rate and regular rhythm.  Pulses: Normal pulses.     Heart sounds: Normal heart sounds.  Pulmonary:     Effort: Pulmonary effort is normal.     Breath sounds: Normal breath sounds.  Neurological:     Mental Status: He is alert.  Psychiatric:        Mood and Affect: Mood normal.        Behavior: Behavior normal.       Assessment & Plan:   1. Uncontrolled type 2 diabetes mellitus with hyperglycemia (HCC) (Primary) Well-controlled and at goal with A1c of 5.6%. Fasting blood sugars in high 90s to low 100s, postprandial up to 190 mg/dL. On Mounjaro  2.5 mg weekly and Lantus  6-8 units every other day at night. Discussed diet, exercise, and insulin resistance. Explained Mounjaro  dose adjustment and Freestyle Libre as a cost-effective alternative to Dexcom. Increased Mounjaro  to 5 mg for  maintenance. Monitor blood sugars, adjust insulin as needed, recommend administering 4 units nightly as needed. Ordered Freestyle Libre for continuous glucose monitoring. Consider discontinuing Dexcom if blood sugars stable. Patient verbalized an understanding of the plan of care and all questions were answered.  - POCT HgB A1C - tirzepatide  (MOUNJARO ) 5 MG/0.5ML Pen; Inject 5 mg into the skin once a week.  Dispense: 6 mL; Refill: 1 - Continuous Glucose Receiver (FREESTYLE LIBRE 14 DAY READER) DEVI; Use as directed.  Dispense: 1 each; Refill: 2 - Continuous Glucose Sensor (FREESTYLE LIBRE 14 DAY SENSOR) MISC; Use as directed.  Dispense: 1 each; Refill: 10  2. NAFLD (nonalcoholic fatty liver disease) Previous abnormal liver function tests, potentially related to blood sugar levels. Repeated liver function tests today. - Hepatic function panel  3. Mixed hyperlipidemia Previous lipid panel with total cholesterol 218 and LDL 153. Will repeat fasting lipid panel today.  - Lipid panel  4. 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 Repeat testosterone  today due to ongoing fatigue.  - Testosterone ,Free and Total  5. Other fatigue Patient reports ongoing fatigue. Review of previous labs shows normal thyroid and blood counts. Will add vitamin B12 and D to labs today. Will update patient with results of all lab work.  - Vitamin B12 - VITAMIN D  25 Hydroxy (Vit-D Deficiency, Fractures)    Return in about 3 months (around 07/15/2024) for Mood f/u, Diabetes f/u.    Evalene Arts, FNP

## 2024-04-17 LAB — HEPATIC FUNCTION PANEL
ALT: 51 IU/L — ABNORMAL HIGH (ref 0–44)
AST: 24 IU/L (ref 0–40)
Albumin: 4.3 g/dL (ref 4.1–5.1)
Alkaline Phosphatase: 108 IU/L (ref 47–123)
Bilirubin Total: 0.5 mg/dL (ref 0.0–1.2)
Bilirubin, Direct: 0.14 mg/dL (ref 0.00–0.40)
Total Protein: 7.5 g/dL (ref 6.0–8.5)

## 2024-04-17 LAB — LIPID PANEL
Chol/HDL Ratio: 4.4 ratio (ref 0.0–5.0)
Cholesterol, Total: 182 mg/dL (ref 100–199)
HDL: 41 mg/dL (ref >39)
LDL Chol Calc (NIH): 127 mg/dL — ABNORMAL HIGH (ref 0–99)
Triglycerides: 75 mg/dL (ref 0–149)
VLDL Cholesterol Cal: 14 mg/dL (ref 5–40)

## 2024-04-17 LAB — VITAMIN D 25 HYDROXY (VIT D DEFICIENCY, FRACTURES): Vit D, 25-Hydroxy: 9.5 ng/mL — ABNORMAL LOW (ref 30.0–100.0)

## 2024-04-17 LAB — VITAMIN B12: Vitamin B-12: 853 pg/mL (ref 232–1245)

## 2024-04-17 LAB — TESTOSTERONE,FREE AND TOTAL
Testosterone, Free: 7.7 pg/mL — ABNORMAL LOW (ref 8.7–25.1)
Testosterone: 423 ng/dL (ref 264–916)

## 2024-04-21 ENCOUNTER — Other Ambulatory Visit: Payer: Self-pay | Admitting: Family Medicine

## 2024-04-21 ENCOUNTER — Ambulatory Visit: Payer: Self-pay | Admitting: Family Medicine

## 2024-04-21 DIAGNOSIS — E559 Vitamin D deficiency, unspecified: Secondary | ICD-10-CM

## 2024-04-21 DIAGNOSIS — E349 Endocrine disorder, unspecified: Secondary | ICD-10-CM

## 2024-04-21 DIAGNOSIS — E66811 Obesity, class 1: Secondary | ICD-10-CM

## 2024-04-21 MED ORDER — VITAMIN D (ERGOCALCIFEROL) 1.25 MG (50000 UNIT) PO CAPS: 50000.0000 [IU] | ORAL_CAPSULE | ORAL | 0 refills | Status: AC

## 2024-04-21 NOTE — Progress Notes (Signed)
Referral placed for urology

## 2024-05-07 ENCOUNTER — Encounter: Payer: Self-pay | Admitting: Neurology

## 2024-05-07 ENCOUNTER — Ambulatory Visit: Admitting: Neurology

## 2024-05-07 NOTE — Telephone Encounter (Signed)
 Mychart sent by pt stating he has not heard anything about recent urology referral that was placed. Miranda, is there any way that you can take a look at this?

## 2024-06-11 ENCOUNTER — Ambulatory Visit: Admitting: Sleep Medicine

## 2024-07-15 ENCOUNTER — Ambulatory Visit: Admitting: Family Medicine
